# Patient Record
Sex: Female | Born: 1965 | Race: Black or African American | Hispanic: No | Marital: Married | Smoking: Current every day smoker
Health system: Southern US, Community
[De-identification: ages and names within clinical notes are randomized; demographics above are authoritative.]

## PROBLEM LIST (undated history)

## (undated) DIAGNOSIS — E785 Hyperlipidemia, unspecified: Secondary | ICD-10-CM

## (undated) DIAGNOSIS — D649 Anemia, unspecified: Secondary | ICD-10-CM

## (undated) DIAGNOSIS — L309 Dermatitis, unspecified: Secondary | ICD-10-CM

## (undated) DIAGNOSIS — M1990 Unspecified osteoarthritis, unspecified site: Secondary | ICD-10-CM

## (undated) DIAGNOSIS — B009 Herpesviral infection, unspecified: Secondary | ICD-10-CM

## (undated) HISTORY — DX: Dermatitis, unspecified: L30.9

## (undated) HISTORY — PX: OTHER SURGICAL HISTORY: SHX169

## (undated) HISTORY — DX: Unspecified osteoarthritis, unspecified site: M19.90

## (undated) HISTORY — DX: Herpesviral infection, unspecified: B00.9

## (undated) HISTORY — DX: Anemia, unspecified: D64.9

## (undated) HISTORY — DX: Hyperlipidemia, unspecified: E78.5

---

## 2002-06-22 ENCOUNTER — Encounter: Payer: Self-pay | Admitting: Emergency Medicine

## 2002-06-22 ENCOUNTER — Emergency Department (HOSPITAL_COMMUNITY): Admission: EM | Admit: 2002-06-22 | Discharge: 2002-06-22 | Payer: Self-pay | Admitting: Emergency Medicine

## 2003-12-21 HISTORY — PX: REDUCTION MAMMAPLASTY: SUR839

## 2005-06-08 ENCOUNTER — Ambulatory Visit (HOSPITAL_COMMUNITY): Admission: RE | Admit: 2005-06-08 | Discharge: 2005-06-08 | Payer: Self-pay | Admitting: Plastic Surgery

## 2006-06-09 ENCOUNTER — Ambulatory Visit (HOSPITAL_COMMUNITY): Admission: RE | Admit: 2006-06-09 | Discharge: 2006-06-09 | Payer: Self-pay | Admitting: Plastic Surgery

## 2006-10-24 ENCOUNTER — Encounter: Admission: RE | Admit: 2006-10-24 | Discharge: 2006-10-24 | Payer: Self-pay | Admitting: Family Medicine

## 2007-02-07 ENCOUNTER — Encounter: Admission: RE | Admit: 2007-02-07 | Discharge: 2007-02-07 | Payer: Self-pay | Admitting: Family Medicine

## 2007-06-10 ENCOUNTER — Emergency Department (HOSPITAL_COMMUNITY): Admission: EM | Admit: 2007-06-10 | Discharge: 2007-06-10 | Payer: Self-pay | Admitting: Emergency Medicine

## 2009-04-09 ENCOUNTER — Encounter: Admission: RE | Admit: 2009-04-09 | Discharge: 2009-04-09 | Payer: Self-pay | Admitting: Family Medicine

## 2009-04-11 ENCOUNTER — Encounter: Admission: RE | Admit: 2009-04-11 | Discharge: 2009-04-11 | Payer: Self-pay | Admitting: Family Medicine

## 2010-11-03 ENCOUNTER — Encounter: Admission: RE | Admit: 2010-11-03 | Discharge: 2010-11-03 | Payer: Self-pay | Admitting: Family Medicine

## 2011-01-10 ENCOUNTER — Encounter: Payer: Self-pay | Admitting: Family Medicine

## 2011-10-06 LAB — I-STAT 8, (EC8 V) (CONVERTED LAB)
Acid-base deficit: 2
BUN: 18
Bicarbonate: 22.6
Chloride: 109
Glucose, Bld: 75
HCT: 32 — ABNORMAL LOW
Hemoglobin: 10.9 — ABNORMAL LOW
Operator id: 161631
Potassium: 4
Sodium: 139
TCO2: 24
pCO2, Ven: 35.2 — ABNORMAL LOW
pH, Ven: 7.414 — ABNORMAL HIGH

## 2011-10-06 LAB — POCT I-STAT CREATININE
Creatinine, Ser: 0.6
Operator id: 161631

## 2011-10-06 LAB — CBC
HCT: 28.1 — ABNORMAL LOW
MCHC: 31.7
Platelets: 244
WBC: 5.1

## 2011-10-06 LAB — POCT CARDIAC MARKERS
CKMB, poc: <1 — ABNORMAL LOW
CKMB, poc: <1 — ABNORMAL LOW
Myoglobin, poc: 26.4
Myoglobin, poc: 26.5
Operator id: 161631
Operator id: 161631
Troponin i, poc: <0.05
Troponin i, poc: <0.05

## 2011-10-06 LAB — POCT PREGNANCY, URINE
Operator id: 161631
Preg Test, Ur: NEGATIVE

## 2011-10-06 LAB — D-DIMER, QUANTITATIVE: D-Dimer, Quant: 1.08 — ABNORMAL HIGH

## 2013-01-10 ENCOUNTER — Emergency Department (HOSPITAL_COMMUNITY)
Admission: EM | Admit: 2013-01-10 | Discharge: 2013-01-10 | Disposition: A | Discharge status: Home / Self Care | Payer: PRIVATE HEALTH INSURANCE | Source: Home / Self Care | Attending: Emergency Medicine | Admitting: Emergency Medicine

## 2013-01-10 ENCOUNTER — Encounter (HOSPITAL_COMMUNITY): Payer: Self-pay | Admitting: *Deleted

## 2013-01-10 DIAGNOSIS — R05 Cough: Secondary | ICD-10-CM

## 2013-01-10 MED ORDER — PREDNISONE 20 MG PO TABS: 40.0000 mg | ORAL_TABLET | Freq: Every day | ORAL | Status: AC

## 2013-01-10 NOTE — ED Provider Notes (Signed)
History     CSN: [id number]  Arrival date & time 01/10/13  1811   First MD Initiated Contact with Patient 01/10/13 1844      Chief Complaint  Patient presents with  . URI    (Consider location/radiation/quality/duration/timing/severity/associated sxs/prior treatment) HPI Comments: Patient presents urgent care this evening complaining that she continues to experience cough and congestion of her chest for the last 2-3 weeks. She has been seen by a provider where she was provided with antibiotics for 5 days. Which she feels helped initially but her symptoms seem to have come back. Patient is coughing up initially dry cough and in told yesterday that she started taking some Mucinex started with a productive cough now. Denies any fevers or shortness of breath. The does have had some sneezing, and some facial congestion. Denies any sore throat. Does feel like something she eats have elicited some episodes of diarrhea.  Patient is a 47 y.o. female presenting with URI. The history is provided by the patient.  URI The primary symptoms include cough and nausea. Primary symptoms do not include fever, fatigue, wheezing, abdominal pain, vomiting, myalgias, arthralgias or rash. The current episode started more than 1 week ago. The problem has not changed since onset. Symptoms associated with the illness include chills, facial pain, congestion and rhinorrhea.    History reviewed. No pertinent past medical history.  Past Surgical History  Procedure Date  . Cesarean section x2  . Breast reduction     Family History  Problem Relation Age of Onset  . Diabetes Other   . Heart failure Other   . Hypertension Other     History  Substance Use Topics  . Smoking status: Current Every Day Smoker -- 0.5 packs/day    Types: Cigarettes  . Smokeless tobacco: Not on file  . Alcohol Use: Yes    OB History    Grav Para Term Preterm Abortions TAB SAB Ect Mult Living                  Review of  Systems  Constitutional: Positive for chills and activity change. Negative for fever, fatigue and unexpected weight change.  HENT: Positive for congestion and rhinorrhea.   Respiratory: Positive for cough. Negative for apnea, chest tightness, shortness of breath, wheezing and stridor.   Gastrointestinal: Positive for nausea. Negative for vomiting and abdominal pain.  Musculoskeletal: Negative for myalgias and arthralgias.  Skin: Negative for color change, pallor and rash.  Neurological: Negative for dizziness.    Allergies  Penicillins  Home Medications   Current Outpatient Rx  Name  Route  Sig  Dispense  Refill  . PREDNISONE 20 MG PO TABS   Oral   Take 2 tablets (40 mg total) by mouth daily. 2 tablets daily for 5 days   10 tablet   0   . VALACYCLOVIR HCL 500 MG PO TABS   Oral   Take 500 mg by mouth 2 (two) times daily.         Marland Kitchen ZOLPIDEM TARTRATE 5 MG PO TABS   Oral   Take 5 mg by mouth at bedtime as needed.           BP 153/90  Pulse 74  Temp 98.7 F (37.1 C) (Oral)  Resp 20  Ht 5\' 1"  (1.549 m)  Wt 150 lb (68.04 kg)  BMI 28.34 kg/m2  SpO2 96%  LMP 01/03/2013  Physical Exam  Nursing note and vitals reviewed. Constitutional: She appears well-developed and well-nourished. No  distress.  HENT:  Right Ear: Tympanic membrane normal.  Left Ear: Tympanic membrane normal.  Nose: Rhinorrhea present.  Mouth/Throat: Uvula is midline. Posterior oropharyngeal erythema present.  Eyes: Conjunctivae normal are normal. Right eye exhibits no discharge. Left eye exhibits no discharge. No scleral icterus.  Neck: Neck supple. No JVD present.  Cardiovascular: Normal rate.  Exam reveals no gallop and no friction rub.   No murmur heard. Pulmonary/Chest: Effort normal and breath sounds normal. No respiratory distress. She has no decreased breath sounds. She has no wheezes. She has no rhonchi. She has no rales. She exhibits no tenderness.  Abdominal: Soft.  Lymphadenopathy:     She has no cervical adenopathy.  Skin: No rash noted. No erythema.    ED Course  Procedures (including critical care time)  Labs Reviewed - No data to display No results found.   1. Cough       MDM  Reactive cough, most likely from recent respiratory process. Patient looks comfortable in no respiratory distress. Afebrile. Normal lung exam. Have encouraged patient to continue with Mucinex if he is prednisone for the next 5 days. Discuss what symptoms should warrant to return or follow-up appointment with her primary care Dr.      Jimmie Molly, MD 01/10/13 [phone]

## 2013-01-10 NOTE — ED Notes (Signed)
Pt reports uri symptoms for the past 3 weeks with no relief from antibiotics that were prescribed by employee dr. - cough, sneezing, facial congestion

## 2013-12-20 ENCOUNTER — Encounter (HOSPITAL_COMMUNITY): Payer: Self-pay | Admitting: Emergency Medicine

## 2013-12-20 ENCOUNTER — Emergency Department (HOSPITAL_COMMUNITY)
Admission: EM | Admit: 2013-12-20 | Discharge: 2013-12-20 | Disposition: A | Discharge status: Home / Self Care | Payer: PRIVATE HEALTH INSURANCE | Attending: Emergency Medicine | Admitting: Emergency Medicine

## 2013-12-20 ENCOUNTER — Emergency Department (HOSPITAL_COMMUNITY): Payer: PRIVATE HEALTH INSURANCE

## 2013-12-20 DIAGNOSIS — M5431 Sciatica, right side: Secondary | ICD-10-CM

## 2013-12-20 DIAGNOSIS — Z79899 Other long term (current) drug therapy: Secondary | ICD-10-CM | POA: Insufficient documentation

## 2013-12-20 DIAGNOSIS — Y929 Unspecified place or not applicable: Secondary | ICD-10-CM | POA: Insufficient documentation

## 2013-12-20 DIAGNOSIS — N390 Urinary tract infection, site not specified: Secondary | ICD-10-CM | POA: Insufficient documentation

## 2013-12-20 DIAGNOSIS — Z880 Allergy status to penicillin: Secondary | ICD-10-CM | POA: Insufficient documentation

## 2013-12-20 DIAGNOSIS — F17200 Nicotine dependence, unspecified, uncomplicated: Secondary | ICD-10-CM | POA: Insufficient documentation

## 2013-12-20 DIAGNOSIS — X500XXA Overexertion from strenuous movement or load, initial encounter: Secondary | ICD-10-CM | POA: Insufficient documentation

## 2013-12-20 DIAGNOSIS — IMO0002 Reserved for concepts with insufficient information to code with codable children: Secondary | ICD-10-CM | POA: Insufficient documentation

## 2013-12-20 DIAGNOSIS — M5430 Sciatica, unspecified side: Secondary | ICD-10-CM | POA: Insufficient documentation

## 2013-12-20 DIAGNOSIS — M549 Dorsalgia, unspecified: Secondary | ICD-10-CM

## 2013-12-20 DIAGNOSIS — Y9389 Activity, other specified: Secondary | ICD-10-CM | POA: Insufficient documentation

## 2013-12-20 LAB — BASIC METABOLIC PANEL
BUN: 17 mg/dL (ref 6–23)
CALCIUM: 8.9 mg/dL (ref 8.4–10.5)
CO2: 25 meq/L (ref 19–32)
CREATININE: 0.5 mg/dL (ref 0.50–1.10)
Chloride: 104 mEq/L (ref 96–112)
Glucose, Bld: 113 mg/dL — ABNORMAL HIGH (ref 70–99)
Potassium: 4.1 mEq/L (ref 3.7–5.3)
Sodium: 139 mEq/L (ref 137–147)

## 2013-12-20 LAB — URINALYSIS, ROUTINE W REFLEX MICROSCOPIC
Bilirubin Urine: NEGATIVE
GLUCOSE, UA: NEGATIVE mg/dL
KETONES UR: NEGATIVE mg/dL
Nitrite: NEGATIVE
PH: 6.5 (ref 5.0–8.0)
PROTEIN: NEGATIVE mg/dL
Specific Gravity, Urine: 1.029 (ref 1.005–1.030)
Urobilinogen, UA: 1 mg/dL (ref 0.0–1.0)

## 2013-12-20 LAB — CBC WITH DIFFERENTIAL/PLATELET
BASOS PCT: 1 % (ref 0–1)
Basophils Absolute: 0 10*3/uL (ref 0.0–0.1)
EOS PCT: 1 % (ref 0–5)
Eosinophils Absolute: 0.1 10*3/uL (ref 0.0–0.7)
HCT: 42.9 % (ref 36.0–46.0)
HEMOGLOBIN: 14.7 g/dL (ref 12.0–15.0)
Lymphocytes Relative: 17 % (ref 12–46)
Lymphs Abs: 1 10*3/uL (ref 0.7–4.0)
MCH: 33.9 pg (ref 26.0–34.0)
MCHC: 34.3 g/dL (ref 30.0–36.0)
MCV: 99.1 fL (ref 78.0–100.0)
MONO ABS: 0.6 10*3/uL (ref 0.1–1.0)
MONOS PCT: 10 % (ref 3–12)
Neutro Abs: 4.2 10*3/uL (ref 1.7–7.7)
Neutrophils Relative %: 71 % (ref 43–77)
PLATELETS: 155 10*3/uL (ref 150–400)
RBC: 4.33 MIL/uL (ref 3.87–5.11)
RDW: 14.1 % (ref 11.5–15.5)
WBC: 5.8 10*3/uL (ref 4.0–10.5)

## 2013-12-20 LAB — URINE MICROSCOPIC-ADD ON

## 2013-12-20 MED ORDER — SODIUM CHLORIDE 0.9 % IV SOLN: INTRAVENOUS | Status: DC

## 2013-12-20 MED ORDER — HYDROMORPHONE HCL 4 MG PO TABS: 4.0000 mg | ORAL_TABLET | Freq: Four times a day (QID) | ORAL | Status: DC | PRN

## 2013-12-20 MED ORDER — HYDROMORPHONE HCL PF 1 MG/ML IJ SOLN
1.0000 mg | Freq: Once | INTRAMUSCULAR | Status: AC
  Administered 2013-12-20: 1 mg via INTRAVENOUS
  Filled 2013-12-20: qty 1

## 2013-12-20 MED ORDER — CIPROFLOXACIN HCL 500 MG PO TABS: 500.0000 mg | ORAL_TABLET | Freq: Two times a day (BID) | ORAL | Status: DC

## 2013-12-20 MED ORDER — NAPROXEN 500 MG PO TABS: 500.0000 mg | ORAL_TABLET | Freq: Two times a day (BID) | ORAL | Status: DC

## 2013-12-20 MED ORDER — CYCLOBENZAPRINE HCL 10 MG PO TABS: 10.0000 mg | ORAL_TABLET | Freq: Two times a day (BID) | ORAL | Status: DC | PRN

## 2013-12-20 MED ORDER — ONDANSETRON HCL 4 MG/2ML IJ SOLN
4.0000 mg | Freq: Once | INTRAMUSCULAR | Status: AC
  Administered 2013-12-20: 4 mg via INTRAVENOUS
  Filled 2013-12-20: qty 2

## 2013-12-20 MED ORDER — SODIUM CHLORIDE 0.9 % IV BOLUS (SEPSIS)
250.0000 mL | Freq: Once | INTRAVENOUS | Status: AC
  Administered 2013-12-20: 250 mL via INTRAVENOUS

## 2013-12-20 NOTE — ED Notes (Signed)
Patient has tried to urinate but cant 

## 2013-12-20 NOTE — ED Notes (Signed)
Bed: WA03 Expected date:  Expected time:  Means of arrival:  Comments: EMS-low back/hip pain/IV pain med.

## 2013-12-20 NOTE — ED Notes (Signed)
Per EMS pt reports back pain with decreased urination since Monday, today pt reports putting her pants on when felt "something popping in lower back" and exrcuciating pain since. Pt was given 250 mcg fentanyl IV en route.

## 2013-12-20 NOTE — ED Provider Notes (Addendum)
CSN: [id number]     Arrival date & time 12/20/13  1253 History   First MD Initiated Contact with Elizabeth Abbott 12/20/13 1254     Chief Complaint  Elizabeth Abbott presents with  . Back Pain   (Consider location/radiation/quality/duration/timing/severity/associated sxs/prior Treatment) Elizabeth Abbott is a 48 y.o. female presenting with back pain. The history is provided by the Elizabeth Abbott and the EMS personnel.  Back Pain Associated symptoms: dysuria   Associated symptoms: no abdominal pain, no chest pain, no fever, no numbness and no weakness    Elizabeth Abbott brought in by EMS. Elizabeth Abbott with complaint of bilateral lower back pain since prior to Christmas. Today while getting dressed that a sudden pop sensation in the left lower part of her back with increased pain. EMS gave Elizabeth Abbott sent and they'll in route Elizabeth Abbott with some improvement. Elizabeth Abbott's also concerned about having urinary tract infection states his shed dysuria. She been drinking a lot of cranberry juice to help avert that. Not sure if the back pain is related to that. Elizabeth Abbott's had a history of some back problems on and off over the years but never this severe. Elizabeth Abbott states she has radiation of the back pain down the right posterior part of the leg to the knee but no numbness or weakness in the foot. Pain currently greater on the left than right pain is 10 out of 10. Radiating as described pain as sharp in nature. No specific injury.  History reviewed. No pertinent past medical history. Past Surgical History  Procedure Laterality Date  . Cesarean section  x2  . Breast reduction     Family History  Problem Relation Age of Onset  . Diabetes Other   . Heart failure Other   . Hypertension Other    History  Substance Use Topics  . Smoking status: Current Every Day Smoker -- 0.50 packs/day    Types: Cigarettes  . Smokeless tobacco: Not on file  . Alcohol Use: Yes   OB History   Grav Para Term Preterm Abortions TAB SAB Ect Mult Living                  Review of Systems  Constitutional: Negative for fever.  HENT: Negative for congestion.   Eyes: Negative for redness.  Respiratory: Negative for shortness of breath.   Cardiovascular: Negative for chest pain.  Gastrointestinal: Negative for abdominal pain.  Genitourinary: Positive for dysuria.  Musculoskeletal: Positive for back pain.  Skin: Negative for rash.  Neurological: Negative for weakness and numbness.  Hematological: Does not bruise/bleed easily.  Psychiatric/Behavioral: Negative for confusion.    Allergies  Penicillins and Percocet  Home Medications   Current Outpatient Rx  Name  Route  Sig  Dispense  Refill  . ibuprofen (ADVIL,MOTRIN) 800 MG tablet   Oral   Take 800 mg by mouth every 8 (eight) hours as needed for mild pain or moderate pain.         . metoprolol succinate (TOPROL-XL) 50 MG 24 hr tablet   Oral   Take 50 mg by mouth daily. Take with or immediately following a meal.         . valACYclovir (VALTREX) 500 MG tablet   Oral   Take 500 mg by mouth 2 (two) times daily as needed (coldsore).          Marland Kitchen zolpidem (AMBIEN) 5 MG tablet   Oral   Take 2.5 mg by mouth at bedtime as needed for sleep.          Marland Kitchen  cyclobenzaprine (FLEXERIL) 10 MG tablet   Oral   Take 1 tablet (10 mg total) by mouth 2 (two) times daily as needed for muscle spasms.   20 tablet   0   . HYDROmorphone (DILAUDID) 4 MG tablet   Oral   Take 1 tablet (4 mg total) by mouth every 6 (six) hours as needed for severe pain.   20 tablet   0   . naproxen (NAPROSYN) 500 MG tablet   Oral   Take 1 tablet (500 mg total) by mouth 2 (two) times daily.   14 tablet   0    BP 133/80  Pulse 87  Temp(Src) 98.1 F (36.7 C) (Oral)  Resp 19  SpO2 98%  LMP 11/25/2013 Physical Exam  Nursing note and vitals reviewed. Constitutional: She is oriented to person, place, and time. She appears well-developed and well-nourished. No distress.  HENT:  Head: Normocephalic and atraumatic.   Mouth/Throat: Oropharynx is clear and moist.  Eyes: Conjunctivae and EOM are normal. Pupils are equal, round, and reactive to light.  Neck: Normal range of motion.  Cardiovascular: Normal rate, regular rhythm and normal heart sounds.   No murmur heard. Pulmonary/Chest: Effort normal and breath sounds normal.  Abdominal: Soft. Bowel sounds are normal.  Musculoskeletal: Normal range of motion. She exhibits tenderness.  Mild tenderness to palpation to the lumbar part of the back bilaterally left greater than right. Lower extremities  without any focal neuro deficit.  Neurological: She is alert and oriented to person, place, and time. No cranial nerve deficit. She exhibits normal muscle tone. Coordination normal.  Skin: Skin is warm. No rash noted.    ED Course  Procedures (including critical care time) Labs Review Labs Reviewed  CBC WITH DIFFERENTIAL  URINALYSIS, ROUTINE W REFLEX MICROSCOPIC  BASIC METABOLIC PANEL   Results for orders placed during the hospital encounter of 12/20/13  CBC WITH DIFFERENTIAL      Result Value Range   WBC 5.8  4.0 - 10.5 K/uL   RBC 4.33  3.87 - 5.11 MIL/uL   Hemoglobin 14.7  12.0 - 15.0 g/dL   HCT 42.9  36.0 - 46.0 %   MCV 99.1  78.0 - 100.0 fL   MCH 33.9  26.0 - 34.0 pg   MCHC 34.3  30.0 - 36.0 g/dL   RDW 14.1  11.5 - 15.5 %   Platelets 155  150 - 400 K/uL   Neutrophils Relative % 71  43 - 77 %   Neutro Abs 4.2  1.7 - 7.7 K/uL   Lymphocytes Relative 17  12 - 46 %   Lymphs Abs 1.0  0.7 - 4.0 K/uL   Monocytes Relative 10  3 - 12 %   Monocytes Absolute 0.6  0.1 - 1.0 K/uL   Eosinophils Relative 1  0 - 5 %   Eosinophils Absolute 0.1  0.0 - 0.7 K/uL   Basophils Relative 1  0 - 1 %   Basophils Absolute 0.0  0.0 - 0.1 K/uL  BASIC METABOLIC PANEL      Result Value Range   Sodium 139  137 - 147 mEq/L   Potassium 4.1  3.7 - 5.3 mEq/L   Chloride 104  96 - 112 mEq/L   CO2 25  19 - 32 mEq/L   Glucose, Bld 113 (*) 70 - 99 mg/dL   BUN 17  6 - 23 mg/dL    Creatinine, Ser 0.50  0.50 - 1.10 mg/dL   Calcium 8.9  8.4 -  10.5 mg/dL   GFR calc non Af Amer >90  >90 mL/min   GFR calc Af Amer >90  >90 mL/min    Imaging Review Dg Lumbar Spine Complete  12/20/2013   CLINICAL DATA:  Injured back.  EXAM: LUMBAR SPINE - COMPLETE 4+ VIEW  COMPARISON:  None.  FINDINGS: Normal alignment of the lumbar vertebral bodies. Disc spaces and vertebral bodies are maintained. No acute bony findings. The facets are normally aligned. No definite pars defects. The visualized bony pelvis is intact.  IMPRESSION: Normal lumbar spine series.   Electronically Signed   By: Kalman Jewels M.D.   On: 12/20/2013 14:51    EKG Interpretation   None       MDM   1. Back pain   2. Sciatica, right    The Elizabeth Abbott most likely with mechanical back pain. X-ray of the area without any acute bony abnormalities. Elizabeth Abbott has some right-sided sciatica had a pop in the left side of the low back earlier today pain on both sides currently greater on the left. Pain radiates in the right posterior part of the leg down to the knee but no numbness or tingling in her weakness in the feet. Elizabeth Abbott has primary care Dr. If the labs and urinalysis are negative Elizabeth Abbott was concerned she had a urinary tract infection would recommend Elizabeth Abbott followup with primary care Dr. for an outpatient MRI and perhaps referral to orthopedics of their choice or neurosurgery of the choice. Work note provided to be off work for the next 5 days.    Mervin Kung, MD 12/20/13 1536  Urinalysis is still pending. Elizabeth Abbott's basic labs are normal.  Mervin Kung, MD 12/20/13 [phone]

## 2013-12-20 NOTE — Discharge Instructions (Signed)
Followup with your Dr. in [facility] area in the next few days to arrange for an outpatient MRI. Take pain medication the muscle relaxer Flexeril and anti-inflammatory medicine as directed. Return for any newer worse symptoms. Rest as much as possible. Work note provided.

## 2013-12-20 NOTE — ED Provider Notes (Addendum)
4:45 PM This care from Dr. Rogene Houston in signout with urinalysis pending. UA is suggestive of infection and with patient's symptoms of dysuria will treat although not greatest sample with many squamous cells.. Culture sent. PCN allergy noted.   Virgel Manifold, MD 12/20/13 1654  Virgel Manifold, MD 12/20/13 [phone]

## 2013-12-20 NOTE — ED Notes (Signed)
Patient will call in a few when she can urinate.

## 2013-12-21 LAB — URINE CULTURE
Colony Count: NO GROWTH
Culture: NO GROWTH

## 2014-04-25 DIAGNOSIS — F5101 Primary insomnia: Secondary | ICD-10-CM | POA: Insufficient documentation

## 2014-04-25 DIAGNOSIS — G4700 Insomnia, unspecified: Secondary | ICD-10-CM | POA: Insufficient documentation

## 2014-04-25 DIAGNOSIS — Z720 Tobacco use: Secondary | ICD-10-CM | POA: Insufficient documentation

## 2014-04-25 DIAGNOSIS — I10 Essential (primary) hypertension: Secondary | ICD-10-CM

## 2014-04-25 HISTORY — DX: Essential (primary) hypertension: I10

## 2014-06-07 DIAGNOSIS — B354 Tinea corporis: Secondary | ICD-10-CM | POA: Insufficient documentation

## 2014-08-08 DIAGNOSIS — M545 Low back pain, unspecified: Secondary | ICD-10-CM | POA: Insufficient documentation

## 2014-08-08 DIAGNOSIS — H6990 Unspecified Eustachian tube disorder, unspecified ear: Secondary | ICD-10-CM | POA: Insufficient documentation

## 2015-03-24 DIAGNOSIS — R197 Diarrhea, unspecified: Secondary | ICD-10-CM | POA: Insufficient documentation

## 2015-03-24 DIAGNOSIS — K219 Gastro-esophageal reflux disease without esophagitis: Secondary | ICD-10-CM | POA: Insufficient documentation

## 2015-03-24 DIAGNOSIS — M25551 Pain in right hip: Secondary | ICD-10-CM | POA: Insufficient documentation

## 2015-03-24 DIAGNOSIS — R200 Anesthesia of skin: Secondary | ICD-10-CM | POA: Insufficient documentation

## 2015-04-19 DIAGNOSIS — M5416 Radiculopathy, lumbar region: Secondary | ICD-10-CM | POA: Insufficient documentation

## 2015-04-19 DIAGNOSIS — M1611 Unilateral primary osteoarthritis, right hip: Secondary | ICD-10-CM | POA: Insufficient documentation

## 2015-08-12 DIAGNOSIS — D219 Benign neoplasm of connective and other soft tissue, unspecified: Secondary | ICD-10-CM | POA: Insufficient documentation

## 2015-08-12 DIAGNOSIS — N920 Excessive and frequent menstruation with regular cycle: Secondary | ICD-10-CM | POA: Insufficient documentation

## 2015-08-15 ENCOUNTER — Encounter (HOSPITAL_COMMUNITY): Payer: Self-pay | Admitting: *Deleted

## 2015-08-15 ENCOUNTER — Emergency Department (HOSPITAL_COMMUNITY): Payer: PRIVATE HEALTH INSURANCE

## 2015-08-15 ENCOUNTER — Emergency Department (HOSPITAL_COMMUNITY)
Admission: EM | Admit: 2015-08-15 | Discharge: 2015-08-15 | Disposition: A | Discharge status: Home / Self Care | Payer: PRIVATE HEALTH INSURANCE | Attending: Emergency Medicine | Admitting: Emergency Medicine

## 2015-08-15 DIAGNOSIS — Z79899 Other long term (current) drug therapy: Secondary | ICD-10-CM | POA: Insufficient documentation

## 2015-08-15 DIAGNOSIS — Z880 Allergy status to penicillin: Secondary | ICD-10-CM | POA: Diagnosis not present

## 2015-08-15 DIAGNOSIS — I10 Essential (primary) hypertension: Secondary | ICD-10-CM | POA: Diagnosis present

## 2015-08-15 DIAGNOSIS — R42 Dizziness and giddiness: Secondary | ICD-10-CM | POA: Insufficient documentation

## 2015-08-15 DIAGNOSIS — Z791 Long term (current) use of non-steroidal anti-inflammatories (NSAID): Secondary | ICD-10-CM | POA: Insufficient documentation

## 2015-08-15 DIAGNOSIS — Z792 Long term (current) use of antibiotics: Secondary | ICD-10-CM | POA: Insufficient documentation

## 2015-08-15 DIAGNOSIS — D259 Leiomyoma of uterus, unspecified: Secondary | ICD-10-CM | POA: Insufficient documentation

## 2015-08-15 DIAGNOSIS — R079 Chest pain, unspecified: Secondary | ICD-10-CM | POA: Diagnosis not present

## 2015-08-15 DIAGNOSIS — Z720 Tobacco use: Secondary | ICD-10-CM | POA: Diagnosis not present

## 2015-08-15 LAB — CBC
HCT: 40.7 % (ref 36.0–46.0)
Hemoglobin: 13.9 g/dL (ref 12.0–15.0)
MCH: 33.4 pg (ref 26.0–34.0)
MCHC: 34.2 g/dL (ref 30.0–36.0)
MCV: 97.8 fL (ref 78.0–100.0)
PLATELETS: 196 10*3/uL (ref 150–400)
RBC: 4.16 MIL/uL (ref 3.87–5.11)
RDW: 13.9 % (ref 11.5–15.5)
WBC: 7 10*3/uL (ref 4.0–10.5)

## 2015-08-15 LAB — I-STAT TROPONIN, ED: Troponin i, poc: 0 ng/mL (ref 0.00–0.08)

## 2015-08-15 LAB — URINALYSIS, ROUTINE W REFLEX MICROSCOPIC
BILIRUBIN URINE: NEGATIVE
Glucose, UA: NEGATIVE mg/dL
HGB URINE DIPSTICK: NEGATIVE
Ketones, ur: NEGATIVE mg/dL
Leukocytes, UA: NEGATIVE
Nitrite: NEGATIVE
PH: 6.5 (ref 5.0–8.0)
Protein, ur: NEGATIVE mg/dL
SPECIFIC GRAVITY, URINE: 1.017 (ref 1.005–1.030)
Urobilinogen, UA: 0.2 mg/dL (ref 0.0–1.0)

## 2015-08-15 LAB — BASIC METABOLIC PANEL
Anion gap: 8 (ref 5–15)
BUN: 9 mg/dL (ref 6–20)
CALCIUM: 9.1 mg/dL (ref 8.9–10.3)
CO2: 23 mmol/L (ref 22–32)
CREATININE: 0.56 mg/dL (ref 0.44–1.00)
Chloride: 105 mmol/L (ref 101–111)
GFR calc Af Amer: >60 mL/min (ref >60)
GFR calc non Af Amer: >60 mL/min (ref >60)
GLUCOSE: 81 mg/dL (ref 65–99)
Potassium: 3.9 mmol/L (ref 3.5–5.1)
Sodium: 136 mmol/L (ref 135–145)

## 2015-08-15 MED ORDER — HYDROCODONE-ACETAMINOPHEN 5-325 MG PO TABS
1.0000 | ORAL_TABLET | Freq: Once | ORAL | Status: AC
  Administered 2015-08-15: 1 via ORAL
  Filled 2015-08-15: qty 1

## 2015-08-15 MED ORDER — HYDROCODONE-ACETAMINOPHEN 5-325 MG PO TABS: 1.0000 | ORAL_TABLET | Freq: Four times a day (QID) | ORAL | Status: DC | PRN

## 2015-08-15 NOTE — Discharge Instructions (Signed)
Fibroids Fibroids are lumps (tumors) that can occur any place in a woman's body. These lumps are not cancerous. Fibroids vary in size, weight, and where they grow. HOME CARE  Do not take aspirin.  Write down the number of pads or tampons you use during your period. Tell your doctor. This can help determine the best treatment for you. GET HELP RIGHT AWAY IF:  You have pain in your lower belly (abdomen) that is not helped with medicine.  You have cramps that are not helped with medicine.  You have more bleeding between or during your period.  You feel lightheaded or pass out (faint).  Your lower belly pain gets worse. MAKE SURE YOU:  Understand these instructions.  Will watch your condition.  Will get help right away if you are not doing well or get worse. Document Released: 01/08/2011 Document Revised: 02/28/2012 Document Reviewed: 01/08/2011 Exit[employer] Patient Information 2015 [employer]. This information is not intended to replace advice given to you by your health care provider. Make sure you discuss any questions you have with your health care provider.  

## 2015-08-15 NOTE — ED Notes (Signed)
Pt reports that she was sent her by her dr for hypertension. Pt reports dizziness, chest palpitations, and lower abdominal pain that has been ongoing. NAD noted in triage.

## 2015-08-15 NOTE — ED Provider Notes (Signed)
CSN: [id number]     Arrival date & time 08/15/15  1156 History   First MD Initiated Contact with Patient 08/15/15 1431     Chief Complaint  Patient presents with  . Hypertension  . Dizziness  . Chest Pain     (Consider location/radiation/quality/duration/timing/severity/associated sxs/prior Treatment) HPI Comments: Patient is a 49 year old female with a history of hypertension on metoprolol and recently diagnosed fibroids 2 presents today with persistent high blood pressure. Patient saw her doctor and 08/12/2015 to get medication refills and at that time was noted to have an elevated blood pressure. She wanted to start a new blood pressure medication however patient was hesitant ciliary increased her metoprolol to 75 mg. Patient has been taking 75 mg for the last 2 days but has noted that she continues to have elevated blood pressures usually 160/100. She denies any chest pain, shortness of breath but will occasionally feel lightheaded. She is also noticed over the last 4 days she has intense abdominal cramping in the suprapubic region that feels like a period cramps but also some slight dysuria associated with it. Her last menstrual period finished last week and she has not noticed any further vaginal bleeding. She denies any vaginal discharge, back pain, nausea, vomiting, diarrhea, constipation or fever.  Patient is a 49 y.o. female presenting with hypertension, dizziness, and chest pain. The history is provided by the patient.  Hypertension This is a recurrent problem. Episode onset: 3 days. The problem occurs constantly. The problem has not changed since onset.Associated symptoms include chest pain.  Dizziness Associated symptoms: chest pain   Chest Pain Associated symptoms: dizziness     History reviewed. No pertinent past medical history. Past Surgical History  Procedure Laterality Date  . Cesarean section  x2  . Breast reduction     Family History  Problem Relation Age of Onset   . Diabetes Other   . Heart failure Other   . Hypertension Other    Social History  Substance Use Topics  . Smoking status: Current Every Day Smoker -- 0.50 packs/day    Types: Cigarettes  . Smokeless tobacco: None  . Alcohol Use: Yes   OB History    No data available     Review of Systems  Cardiovascular: Positive for chest pain.  Neurological: Positive for dizziness.  All other systems reviewed and are negative.     Allergies  Penicillins and Percocet  Home Medications   Prior to Admission medications   Medication Sig Start Date End Date Taking? Authorizing Provider  ciprofloxacin (CIPRO) 500 MG tablet Take 1 tablet (500 mg total) by mouth every 12 (twelve) hours. 12/20/13   Virgel Manifold, MD  cyclobenzaprine (FLEXERIL) 10 MG tablet Take 1 tablet (10 mg total) by mouth 2 (two) times daily as needed for muscle spasms. 12/20/13   Fredia Sorrow, MD  HYDROcodone-acetaminophen (NORCO/VICODIN) 5-325 MG per tablet Take 1-2 tablets by mouth every 6 (six) hours as needed. 08/15/15   Blanchie Dessert, MD  HYDROmorphone (DILAUDID) 4 MG tablet Take 1 tablet (4 mg total) by mouth every 6 (six) hours as needed for severe pain. 12/20/13   Fredia Sorrow, MD  ibuprofen (ADVIL,MOTRIN) 800 MG tablet Take 800 mg by mouth every 8 (eight) hours as needed for mild pain or moderate pain.    Historical Provider, MD  metoprolol succinate (TOPROL-XL) 50 MG 24 hr tablet Take 50 mg by mouth daily. Take with or immediately following a meal.    Historical Provider, MD  naproxen (NAPROSYN)  500 MG tablet Take 1 tablet (500 mg total) by mouth 2 (two) times daily. 12/20/13   Fredia Sorrow, MD  valACYclovir (VALTREX) 500 MG tablet Take 500 mg by mouth 2 (two) times daily as needed (coldsore).     Historical Provider, MD  zolpidem (AMBIEN) 5 MG tablet Take 2.5 mg by mouth at bedtime as needed for sleep.     Historical Provider, MD   BP 140/80 mmHg  Pulse 60  Temp(Src) 98.8 F (37.1 C) (Oral)  Resp 18  Ht 5'  1" (1.549 m)  Wt 159 lb (72.122 kg)  BMI 30.06 kg/m2  SpO2 100%  LMP 08/13/2015 Physical Exam  Constitutional: She is oriented to person, place, and time. She appears well-developed and well-nourished. No distress.  HENT:  Head: Normocephalic and atraumatic.  Mouth/Throat: Oropharynx is clear and moist.  Eyes: Conjunctivae and EOM are normal. Pupils are equal, round, and reactive to light.  Neck: Normal range of motion. Neck supple.  Cardiovascular: Normal rate, regular rhythm and intact distal pulses.   No murmur heard. Pulmonary/Chest: Effort normal and breath sounds normal. No respiratory distress. She has no wheezes. She has no rales.  Abdominal: Soft. She exhibits no distension. There is tenderness in the suprapubic area. There is no rebound, no guarding and no CVA tenderness.  Musculoskeletal: Normal range of motion. She exhibits no edema or tenderness.  Neurological: She is alert and oriented to person, place, and time.  Skin: Skin is warm and dry. No rash noted. No erythema.  Psychiatric: She has a normal mood and affect. Her behavior is normal.  Nursing note and vitals reviewed.   ED Course  Procedures (including critical care time) Labs Review Labs Reviewed  BASIC METABOLIC PANEL  CBC  URINALYSIS, ROUTINE W REFLEX MICROSCOPIC (NOT AT [facility])  Randolm Idol, ED    Imaging Review Dg Chest 2 View  08/15/2015   CLINICAL DATA:  Onset of dizziness and hypertension today, current tobacco use.  EXAM: CHEST  2 VIEW  COMPARISON:  PA and lateral chest x-ray of June 10, 2007  FINDINGS: The lungs are adequately inflated and clear. The heart and pulmonary vascularity are normal. The mediastinum is normal in width. There is no pleural effusion. The bony thorax is unremarkable.  IMPRESSION: There is no active cardiopulmonary disease.   Electronically Signed   By: David  Martinique M.D.   On: 08/15/2015 13:50   I have personally reviewed and evaluated these images and lab results as part  of my medical decision-making.   EKG Interpretation   Date/Time:  Friday August 15 2015 12:06:45 EDT Ventricular Rate:  75 PR Interval:  158 QRS Duration: 90 QT Interval:  364 QTC Calculation: 406 R Axis:   2 Text Interpretation:  Normal sinus rhythm Normal ECG No significant change  since last tracing Confirmed by Maryan Rued  MD, Loree Fee ([id number]) on 08/15/2015  2:16:14 PM      MDM   Final diagnoses:  Essential hypertension  Uterine leiomyoma, unspecified location    Patient with a history of hypertension and fibroids currently taking metoprolol presents today for persistent hypertension and 4 days of lower abdominal pain that feels like period cramps with some mild dysuria. Patient saw her doctor 3 days ago to get medication refills and that time they noticed her blood pressure was elevated. The patient that time did not want to try new medications so they increased her Toprol to 75 mg. Since that time she's had persistently elevated blood pressure and persistent abdominal  cramping in her doctor recommended she come here today. Patient is hemodynamically stable on exam and repeat pressures here have improved to 140/80. Patient has suprapubic abdominal tenderness with a negative urine and feel most likely this is related to her fibroids. She is in the process of making an appointment with an OB/GYN for further evaluation. She has no evidence of anemia, no acute EKG findings normal renal function and electrolytes and a negative chest x-ray.  No signs of end organ damage from her hypertension and it is improved here with rest. Patient given pain control for her fibroids as she denies any complaints concerning for GI symptoms and patient is sexually active with only one partner without symptoms of vaginal discharge or concerns for PID.    Blanchie Dessert, MD 08/15/15 1538

## 2015-08-27 DIAGNOSIS — F17200 Nicotine dependence, unspecified, uncomplicated: Secondary | ICD-10-CM | POA: Insufficient documentation

## 2015-10-27 DIAGNOSIS — G44229 Chronic tension-type headache, not intractable: Secondary | ICD-10-CM | POA: Insufficient documentation

## 2015-12-22 ENCOUNTER — Ambulatory Visit (INDEPENDENT_AMBULATORY_CARE_PROVIDER_SITE_OTHER): Payer: Managed Care, Other (non HMO) | Admitting: Emergency Medicine

## 2015-12-22 VITALS — BP 122/76 | HR 118 | Temp 99.5°F | Resp 17 | Ht 63.0 in | Wt 163.0 lb

## 2015-12-22 DIAGNOSIS — J209 Acute bronchitis, unspecified: Secondary | ICD-10-CM | POA: Diagnosis not present

## 2015-12-22 DIAGNOSIS — J0140 Acute pansinusitis, unspecified: Secondary | ICD-10-CM | POA: Diagnosis not present

## 2015-12-22 MED ORDER — LEVOFLOXACIN 500 MG PO TABS: 500.0000 mg | ORAL_TABLET | Freq: Every day | ORAL | Status: AC

## 2015-12-22 MED ORDER — HYDROCOD POLST-CPM POLST ER 10-8 MG/5ML PO SUER: 5.0000 mL | Freq: Two times a day (BID) | ORAL | Status: DC

## 2015-12-22 MED ORDER — PSEUDOEPHEDRINE-GUAIFENESIN ER 60-600 MG PO TB12: 1.0000 | ORAL_TABLET | Freq: Two times a day (BID) | ORAL | Status: AC

## 2015-12-22 NOTE — Progress Notes (Signed)
Subjective:  Patient ID: Elizabeth Abbott, female    DOB: 18-Mar-1966  Age: 50 y.o. MRN: [id number]  CC: Sore Throat; URI; Headache; Chills; and Leg Pain   HPI Elizabeth Abbott presents   Patient's several-day history chills and fever. No nasal congestion postnasal drainage is purulent character. Her cheeks and has a recent onset sneezing. She has a cough productive purulent sputum. She has no wheezing or shortness of breath. No nausea or vomiting no stool change. No rash.  History Elizabeth Abbott has no past medical history on file.   She has past surgical history that includes Cesarean section (x2) and breast reduction.   Her  family history includes Diabetes in her other; Heart failure in her other; Hypertension in her other.  She   reports that she has been smoking Cigarettes.  She has a 10 pack-year smoking history. She does not have any smokeless tobacco history on file. She reports that she drinks alcohol. She reports that she does not use illicit drugs.  Outpatient Prescriptions Prior to Visit  Medication Sig Dispense Refill  . ibuprofen (ADVIL,MOTRIN) 800 MG tablet Take 800 mg by mouth every 8 (eight) hours as needed for mild pain or moderate pain.    . valACYclovir (VALTREX) 500 MG tablet Take 500 mg by mouth 2 (two) times daily as needed (c[place]ore).     Marland Kitchen zolpidem (AMBIEN) 5 MG tablet Take 2.5 mg by mouth at bedtime as needed for sleep.     . ciprofloxacin (CIPRO) 500 MG tablet Take 1 tablet (500 mg total) by mouth every 12 (twelve) hours. 8 tablet 0  . cyclobenzaprine (FLEXERIL) 10 MG tablet Take 1 tablet (10 mg total) by mouth 2 (two) times daily as needed for muscle spasms. 20 tablet 0  . HYDROcodone-acetaminophen (NORCO/VICODIN) 5-325 MG per tablet Take 1-2 tablets by mouth every 6 (six) hours as needed. 15 tablet 0  . HYDROmorphone (DILAUDID) 4 MG tablet Take 1 tablet (4 mg total) by mouth every 6 (six) hours as needed for severe pain. 20 tablet 0  . metoprolol succinate  (TOPROL-XL) 50 MG 24 hr tablet Take 50 mg by mouth daily. Take with or immediately following a meal.    . naproxen (NAPROSYN) 500 MG tablet Take 1 tablet (500 mg total) by mouth 2 (two) times daily. 14 tablet 0   No facility-administered medications prior to visit.    Social History   Social History  . Marital Status: Single    Spouse Name: N/A  . Number of Children: N/A  . Years of Education: N/A   Social History Main Topics  . Smoking status: Current Every Day Smoker -- 0.50 packs/day for 20 years    Types: Cigarettes  . Smokeless tobacco: None  . Alcohol Use: 0.0 oz/week    0 Standard drinks or equivalent per week  . Drug Use: No  . Sexual Activity: Not Asked   Other Topics Concern  . None   Social History Narrative     Review of Systems  Constitutional: Positive for chills and fatigue. Negative for fever and appetite change.  HENT: Positive for congestion, postnasal drip, rhinorrhea, sinus pressure and sneezing. Negative for ear pain and sore throat.   Eyes: Negative for pain and redness.  Respiratory: Positive for cough. Negative for shortness of breath and wheezing.   Cardiovascular: Negative for leg swelling.  Gastrointestinal: Negative for nausea, vomiting, abdominal pain, diarrhea, constipation and blood in stool.  Endocrine: Negative for polyuria.  Genitourinary: Negative for dysuria,  urgency, frequency and flank pain.  Musculoskeletal: Negative for gait problem.  Skin: Negative for rash.  Neurological: Negative for weakness and headaches.  Psychiatric/Behavioral: Negative for confusion and decreased concentration. The patient is not nervous/anxious.     Objective:  BP 122/76 mmHg  Pulse 118  Temp(Src) 99.5 F (37.5 C) (Oral)  Resp 17  Ht 5\' 3"  (1.6 m)  Wt 163 lb (73.936 kg)  BMI 28.88 kg/m2  SpO2 99%  LMP 12/22/2015  Physical Exam  Constitutional: She is oriented to person, place, and time. She appears well-developed and well-nourished. No  distress.  HENT:  Head: Normocephalic and atraumatic.  Right Ear: External ear normal.  Left Ear: External ear normal.  Nose: Nose normal.  Eyes: Conjunctivae and EOM are normal. Pupils are equal, round, and reactive to light. No scleral icterus.  Neck: Normal range of motion. Neck supple. No tracheal deviation present.  Cardiovascular: Normal rate, regular rhythm and normal heart sounds.   Pulmonary/Chest: Effort normal. No respiratory distress. She has no wheezes. She has no rales.  Abdominal: She exhibits no mass. There is no tenderness. There is no rebound and no guarding.  Musculoskeletal: She exhibits no edema.  Lymphadenopathy:    She has no cervical adenopathy.  Neurological: She is alert and oriented to person, place, and time. Coordination normal.  Skin: Skin is warm and dry. No rash noted.  Psychiatric: She has a normal mood and affect. Her behavior is normal.      Assessment & Plan:   Elizabeth Abbott was seen today for sore throat, uri, headache, chills and leg pain.  Diagnoses and all orders for this visit:  Acute bronchitis, unspecified organism  Acute pansinusitis, recurrence not specified  Other orders -     pseudoephedrine-guaifenesin (MUCINEX D) 60-600 MG 12 hr tablet; Take 1 tablet by mouth every 12 (twelve) hours. -     chlorpheniramine-HYDROcodone (TUSSIONEX PENNKINETIC ER) 10-8 MG/5ML SUER; Take 5 mLs by mouth 2 (two) times daily. -     levofloxacin (LEVAQUIN) 500 MG tablet; Take 1 tablet (500 mg total) by mouth daily.   I have discontinued Elizabeth Abbott's metoprolol succinate, cyclobenzaprine, HYDROmorphone, naproxen, ciprofloxacin, and HYDROcodone-acetaminophen. I am also having her start on pseudoephedrine-guaifenesin, chlorpheniramine-HYDROcodone, and levofloxacin. Additionally, I am having her maintain her zolpidem, valACYclovir, ibuprofen, and losartan.  Meds ordered this encounter  Medications  . losartan (COZAAR) 50 MG tablet    Sig: Take 50 mg by mouth  daily.  . pseudoephedrine-guaifenesin (MUCINEX D) 60-600 MG 12 hr tablet    Sig: Take 1 tablet by mouth every 12 (twelve) hours.    Dispense:  18 tablet    Refill:  0  . chlorpheniramine-HYDROcodone (TUSSIONEX PENNKINETIC ER) 10-8 MG/5ML SUER    Sig: Take 5 mLs by mouth 2 (two) times daily.    Dispense:  60 mL    Refill:  0  . levofloxacin (LEVAQUIN) 500 MG tablet    Sig: Take 1 tablet (500 mg total) by mouth daily.    Dispense:  10 tablet    Refill:  0    Appropriate red flag conditions were discussed with the patient as well as actions that should be taken.  Patient expressed his understanding.  Follow-up: Return if symptoms worsen or fail to improve.  Roselee Culver, MD

## 2015-12-22 NOTE — Patient Instructions (Signed)

## 2016-08-17 DIAGNOSIS — Z23 Encounter for immunization: Secondary | ICD-10-CM | POA: Insufficient documentation

## 2017-08-18 ENCOUNTER — Encounter (HOSPITAL_COMMUNITY): Payer: Self-pay | Admitting: Nurse Practitioner

## 2017-08-18 ENCOUNTER — Ambulatory Visit (HOSPITAL_COMMUNITY)
Admission: EM | Admit: 2017-08-18 | Discharge: 2017-08-18 | Disposition: A | Discharge status: Home / Self Care | Payer: BLUE CROSS/BLUE SHIELD | Attending: Family Medicine | Admitting: Family Medicine

## 2017-08-18 DIAGNOSIS — B9789 Other viral agents as the cause of diseases classified elsewhere: Secondary | ICD-10-CM | POA: Diagnosis not present

## 2017-08-18 DIAGNOSIS — J069 Acute upper respiratory infection, unspecified: Secondary | ICD-10-CM

## 2017-08-18 MED ORDER — FLUTICASONE PROPIONATE 50 MCG/ACT NA SUSP: 2.0000 | Freq: Every day | NASAL | 2 refills | Status: DC

## 2017-08-18 MED ORDER — BENZONATATE 100 MG PO CAPS: 100.0000 mg | ORAL_CAPSULE | Freq: Three times a day (TID) | ORAL | 0 refills | Status: DC

## 2017-08-18 NOTE — ED Triage Notes (Signed)
Pt presents with c/o URI symptoms. Her symptoms began last night and have gotten worse throughout the day today. She c/o headache, ear fullness, nasal congestion, rhinorrhea, cough. She hasnt tried anything at home because she has a history of hypertension and doesn't want to take anything that will increase her blood pressure.

## 2017-08-22 NOTE — ED Provider Notes (Signed)
  [place]   [id number] 08/18/17 Arrival Time: 1801  ASSESSMENT & PLAN:  1. Viral URI with cough    Meds ordered this encounter  Medications  . benzonatate (TESSALON) 100 MG capsule    Sig: Take 1 capsule (100 mg total) by mouth every 8 (eight) hours.    Dispense:  21 capsule    Refill:  0  . fluticasone (FLONASE) 50 MCG/ACT nasal spray    Sig: Place 2 sprays into both nostrils daily.    Dispense:  16 g    Refill:  2   OTC analgesics and symptom care as needed.  Reviewed expectations re: course of current medical issues. Questions answered. Outlined signs and symptoms indicating need for more acute intervention. Patient verbalized understanding. After Visit Summary given.   SUBJECTIVE:  Elizabeth Abbott is a 51 y.o. female who presents with complaint of nasal congestion, post-nasal drainage. Acute onset last evening. Headache and ear fullness today. Persistent cough, non-productive. No wheezing or SOB. No n/v. Overall very fatigued. Some body aches. No OTC treatment. Smoker.   ROS: As per HPI.   OBJECTIVE:  Vitals:   08/18/17 1847  BP: 101/62  Pulse: 82  Resp: 17  Temp: 98.5 F (36.9 C)  TempSrc: Oral  SpO2: 99%     General appearance: alert; no distress HEENT: nasal congestion; clear runny nose; throat irritation secondary to post-nasal drainage Neck: supple without LAD Lungs: clear to auscultation bilaterally; dry cough Skin: warm and dry Psychological: alert and cooperative; normal mood and affect  Allergies  Allergen Reactions  . Penicillins     welts  . Percocet [Oxycodone-Acetaminophen] Itching   No PMH of lung disease.  Social History   Social History  . Marital status: Single    Spouse name: N/A  . Number of children: N/A  . Years of education: N/A   Occupational History  . Not on file.   Social History Main Topics  . Smoking status: Current Every Day Smoker    Packs/day: 0.50    Years: 20.00    Types: Cigarettes  .  Smokeless tobacco: Never Used  . Alcohol use 0.0 oz/week  . Drug use: No  . Sexual activity: Not on file   Other Topics Concern  . Not on file   Social History Narrative  . No narrative on file           Vanessa Kick, MD 08/22/17 [phone]

## 2018-01-27 DIAGNOSIS — E785 Hyperlipidemia, unspecified: Secondary | ICD-10-CM | POA: Insufficient documentation

## 2018-01-27 DIAGNOSIS — J989 Respiratory disorder, unspecified: Secondary | ICD-10-CM | POA: Insufficient documentation

## 2018-01-27 DIAGNOSIS — B009 Herpesviral infection, unspecified: Secondary | ICD-10-CM | POA: Insufficient documentation

## 2018-01-27 DIAGNOSIS — J302 Other seasonal allergic rhinitis: Secondary | ICD-10-CM | POA: Insufficient documentation

## 2019-10-16 ENCOUNTER — Other Ambulatory Visit: Payer: Self-pay | Admitting: Family Medicine

## 2019-10-16 DIAGNOSIS — F419 Anxiety disorder, unspecified: Secondary | ICD-10-CM | POA: Insufficient documentation

## 2019-10-16 DIAGNOSIS — N6310 Unspecified lump in the right breast, unspecified quadrant: Secondary | ICD-10-CM

## 2019-10-25 ENCOUNTER — Ambulatory Visit
Admission: RE | Admit: 2019-10-25 | Discharge: 2019-10-25 | Disposition: A | Discharge status: Home / Self Care | Payer: PRIVATE HEALTH INSURANCE | Source: Ambulatory Visit | Attending: Family Medicine | Admitting: Family Medicine

## 2019-10-25 ENCOUNTER — Other Ambulatory Visit: Payer: Self-pay

## 2019-10-25 DIAGNOSIS — N6310 Unspecified lump in the right breast, unspecified quadrant: Secondary | ICD-10-CM

## 2019-11-21 ENCOUNTER — Other Ambulatory Visit: Payer: Self-pay

## 2019-11-21 DIAGNOSIS — Z20822 Contact with and (suspected) exposure to covid-19: Secondary | ICD-10-CM

## 2019-11-24 ENCOUNTER — Telehealth: Payer: Self-pay | Admitting: Family Medicine

## 2019-11-24 LAB — NOVEL CORONAVIRUS, NAA: SARS-CoV-2, NAA: DETECTED — AB

## 2019-11-24 NOTE — Telephone Encounter (Signed)
Pt returning a call to get COVID results.

## 2019-11-27 ENCOUNTER — Telehealth: Payer: Self-pay | Admitting: *Deleted

## 2019-11-27 NOTE — Telephone Encounter (Signed)
Charted in error; see TE from 11/27/2019

## 2019-11-27 NOTE — Telephone Encounter (Signed)
Pt called requesting note for clearance to return to work; explained that she has to meet the previously discussed criteria for d/c of quarantine; also explained she needs to contact her HR for specific guidelines for return; pt also advised to contact her PCP; she verbalized understanding.

## 2020-03-31 IMAGING — US US BREAST*R* LIMITED INC AXILLA
1 series · 2 of 2 positions shown · non-contrast
Comparison: Previous exam(s).

CLINICAL DATA: Patient presents with a palpable area of concern in
the lower medial right breast. History of bilateral reduction
mammoplasty in 0221.

EXAM:
DIGITAL DIAGNOSTIC BILATERAL MAMMOGRAM WITH CAD AND TOMO
ULTRASOUND RIGHT BREAST

[Series 1: us breast*right* limited inc axilla · 0.06mm/px · 2 of 2 slices shown]
[im 1/2]
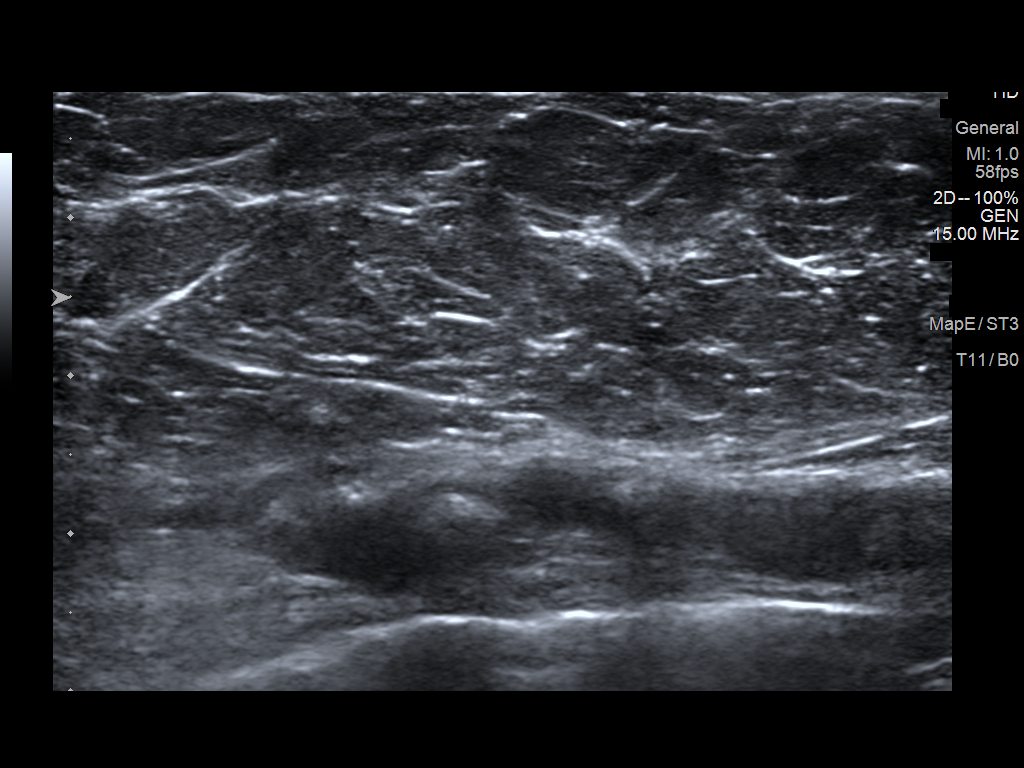
[im 2/2]
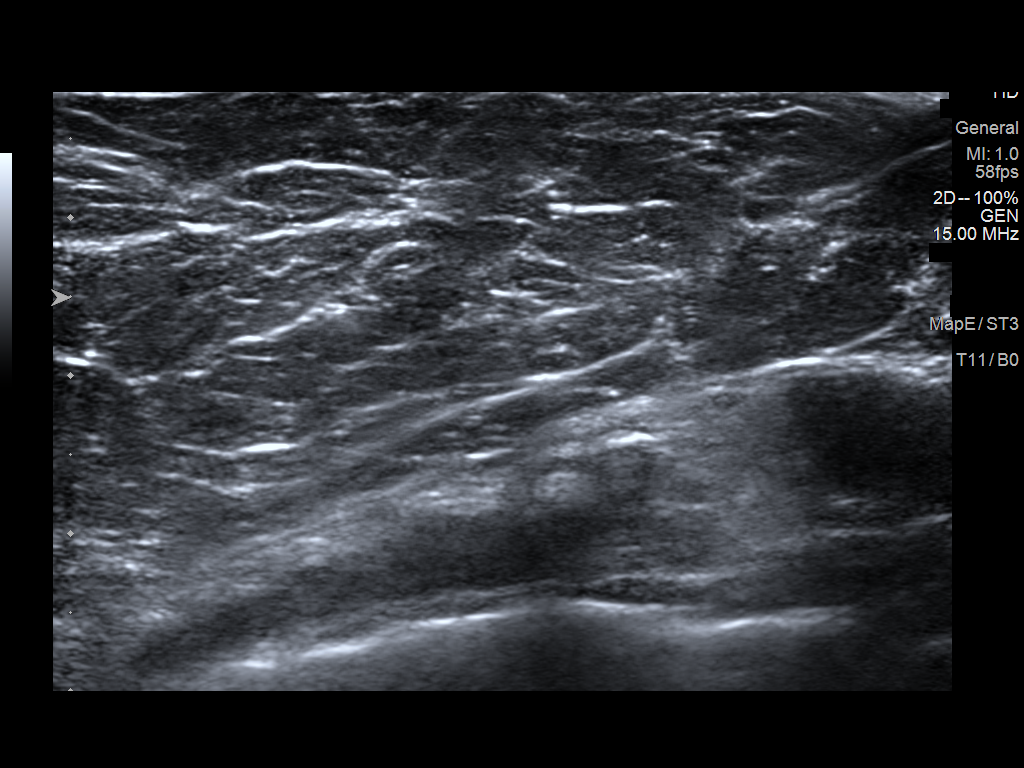

[2 of 2 positions shown; findings below may reference images not displayed]

ACR Breast Density Category b: There are scattered areas of
fibroglandular density.
FINDINGS: Mammogram:

Right breast: No suspicious mass, distortion, or microcalcifications
are identified to suggest presence of malignancy. There are post
reduction surgical changes in the inferior right breast.

Left breast:No suspicious mass, distortion, or microcalcifications
are identified to suggest presence of malignancy. There are post
reduction surgical changes in the inferior left breast.

Mammographic images were processed with CAD.

On physical exam I palpate thickening along the patient's scar in
the inferior medial aspect of the breast at her palpable site of
concern. No discrete fixed mass.

Ultrasound:

Targeted ultrasound is performed at 4 o'clock 9 cm from the nipple
demonstrating no discrete cystic or solid abnormality.
IMPRESSION: No mammographic or sonographic evidence of malignancy. At the
palpable site of concern in the right breast at 4 o'clock there is
no suspicious abnormality. The patient may be feeling scar tissue
from reduction mammoplasty.

RECOMMENDATION:
Screening mammogram in one year.(Code:04-J-H3S)

I have discussed the findings and recommendations with the patient.
If applicable, a reminder letter will be sent to the patient
regarding the next appointment.

BI-RADS CATEGORY  1: Negative.

## 2020-03-31 IMAGING — MG DIGITAL DIAGNOSTIC BILAT W/ TOMO W/ CAD
6 of 9 series · 6 of 25 positions shown · non-contrast
Comparison: Previous exam(s).

CLINICAL DATA: Patient presents with a palpable area of concern in
the lower medial right breast. History of bilateral reduction
mammoplasty in 0221.

EXAM:
DIGITAL DIAGNOSTIC BILATERAL MAMMOGRAM WITH CAD AND TOMO
ULTRASOUND RIGHT BREAST

[L ML]
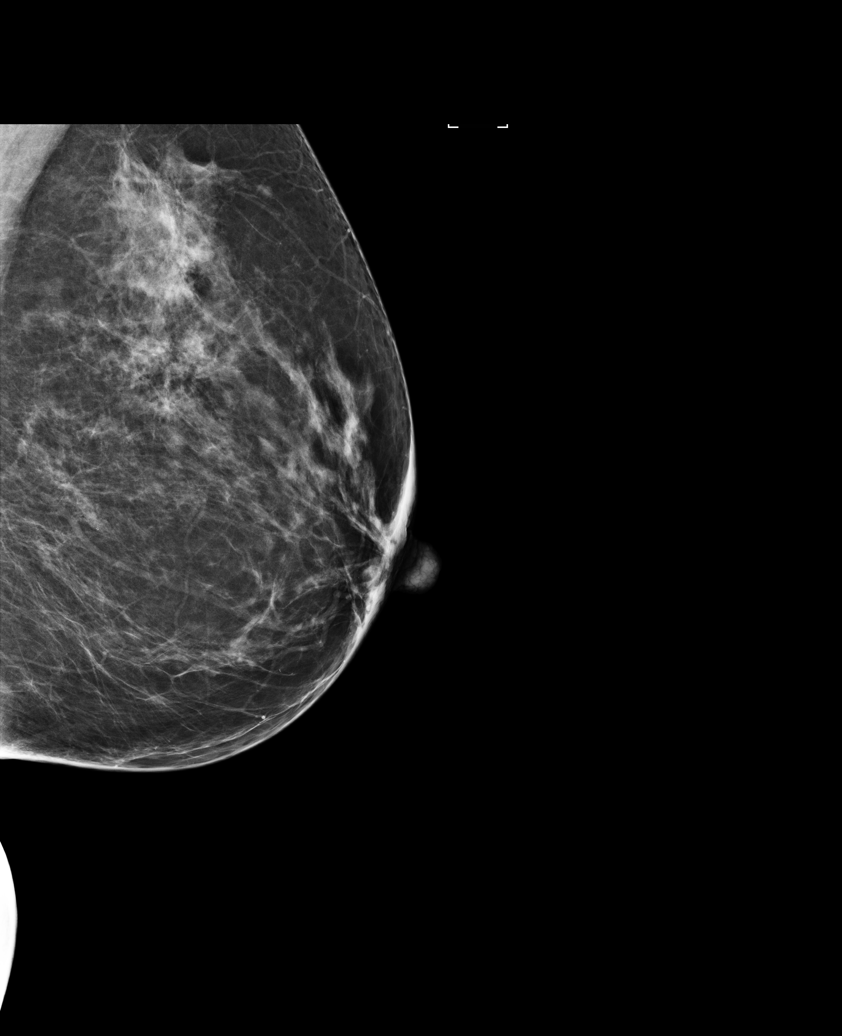

[L MLO synth-2D]
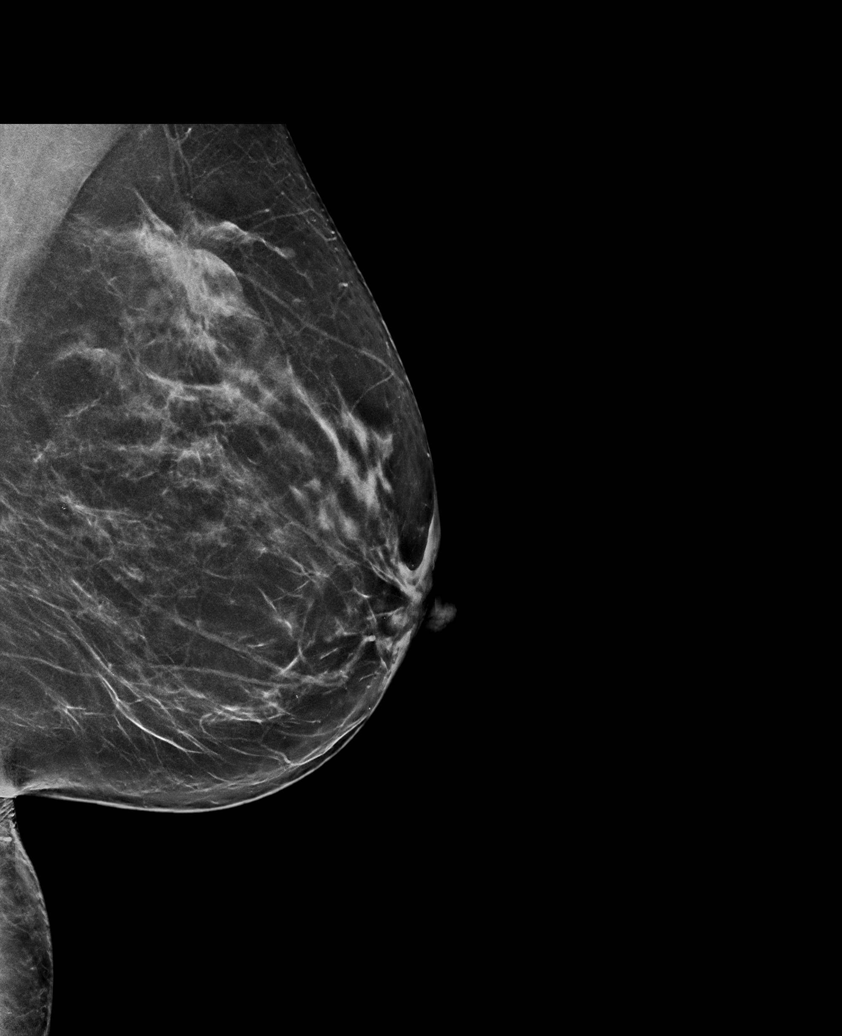

[L CC synth-2D]
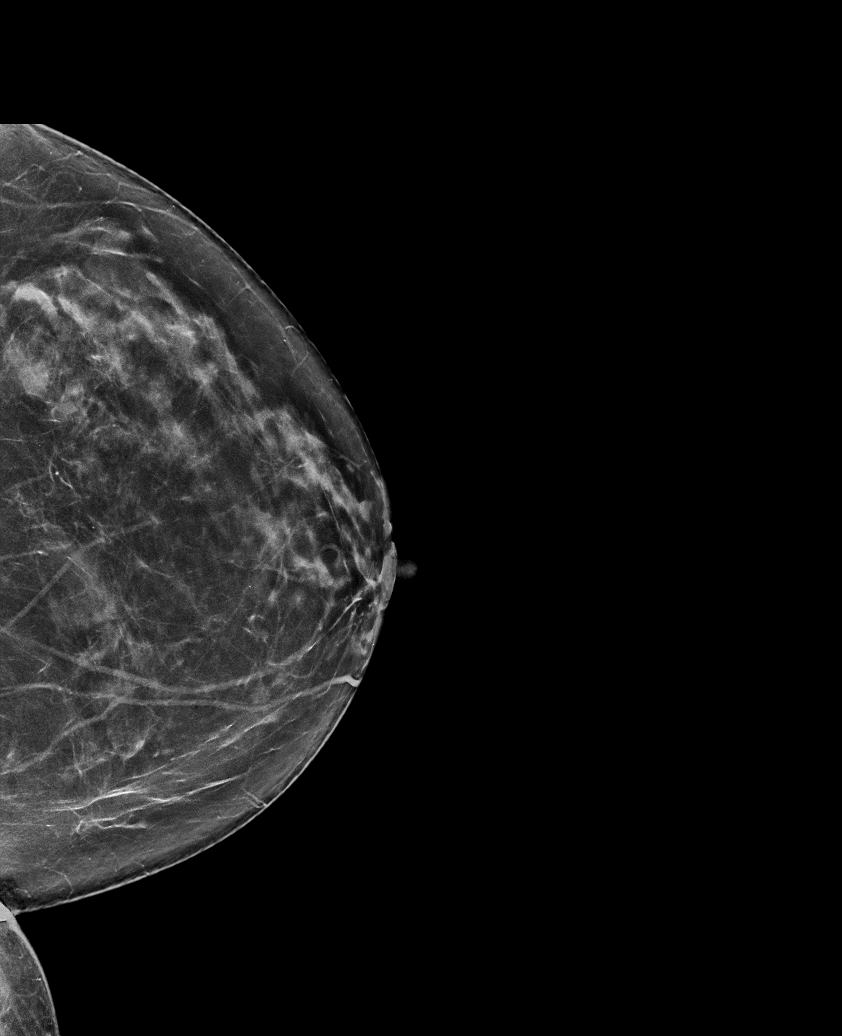

[R CC synth-2D]
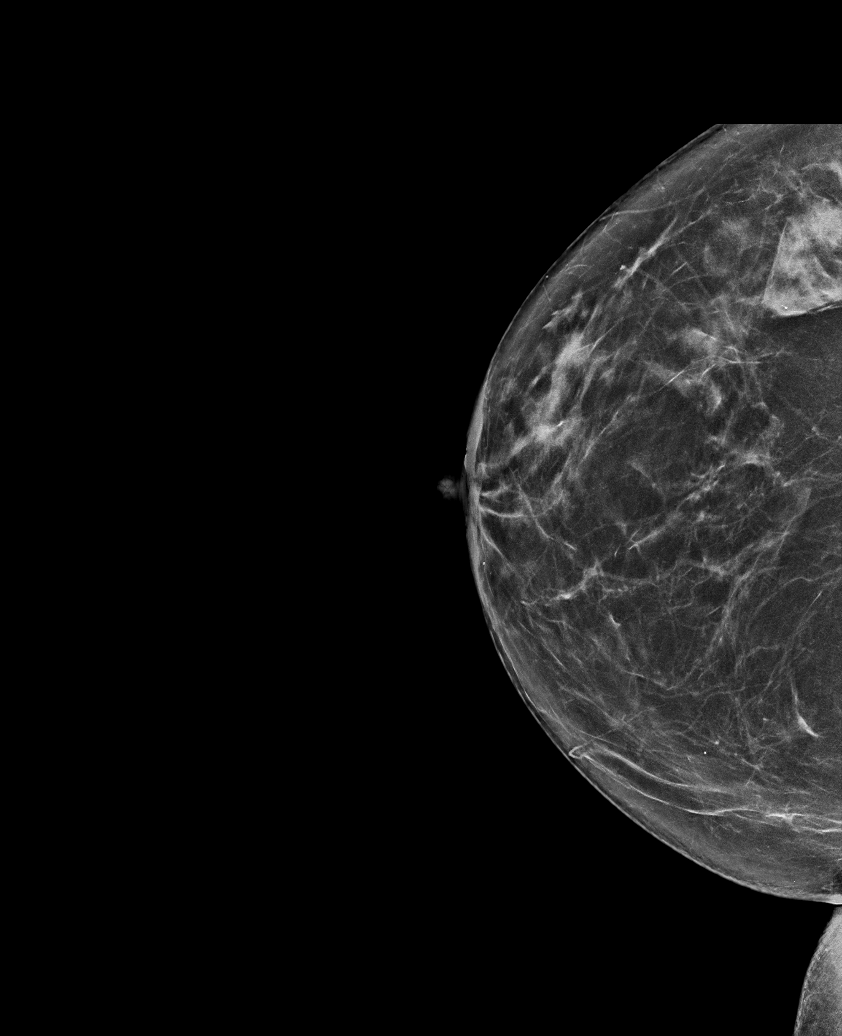

[R MLO synth-2D]
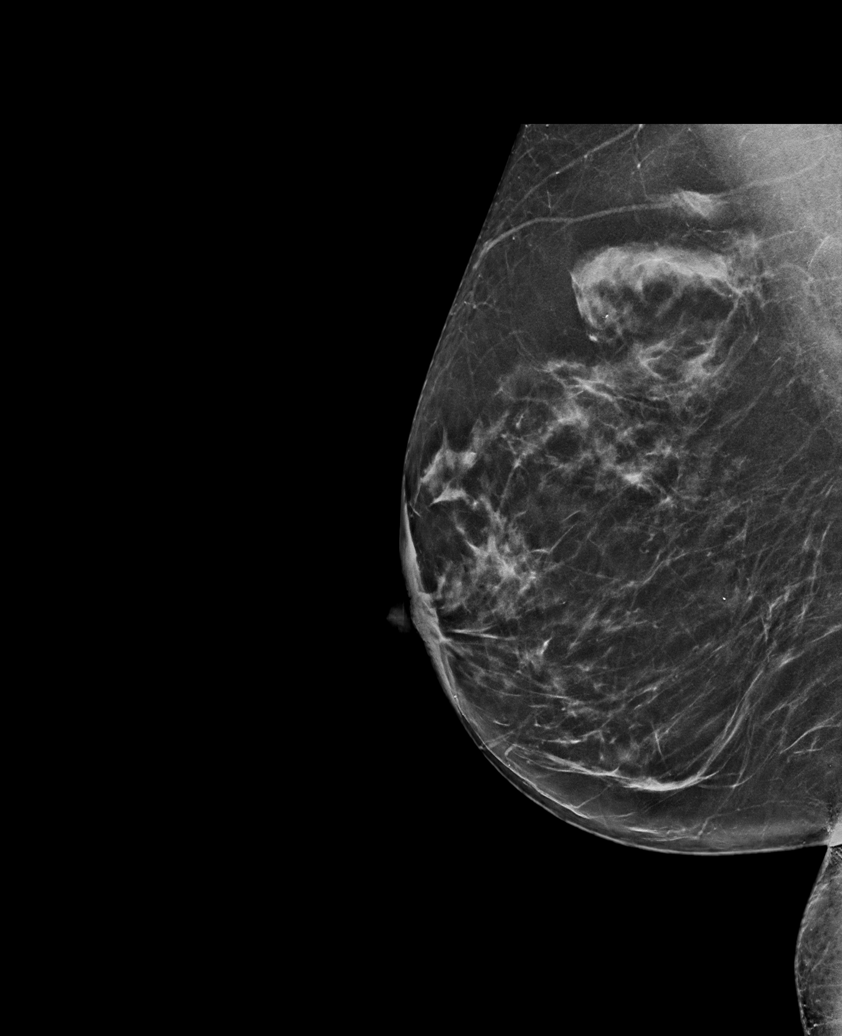

[L MLO tomo · tomo slice 37/74.0]
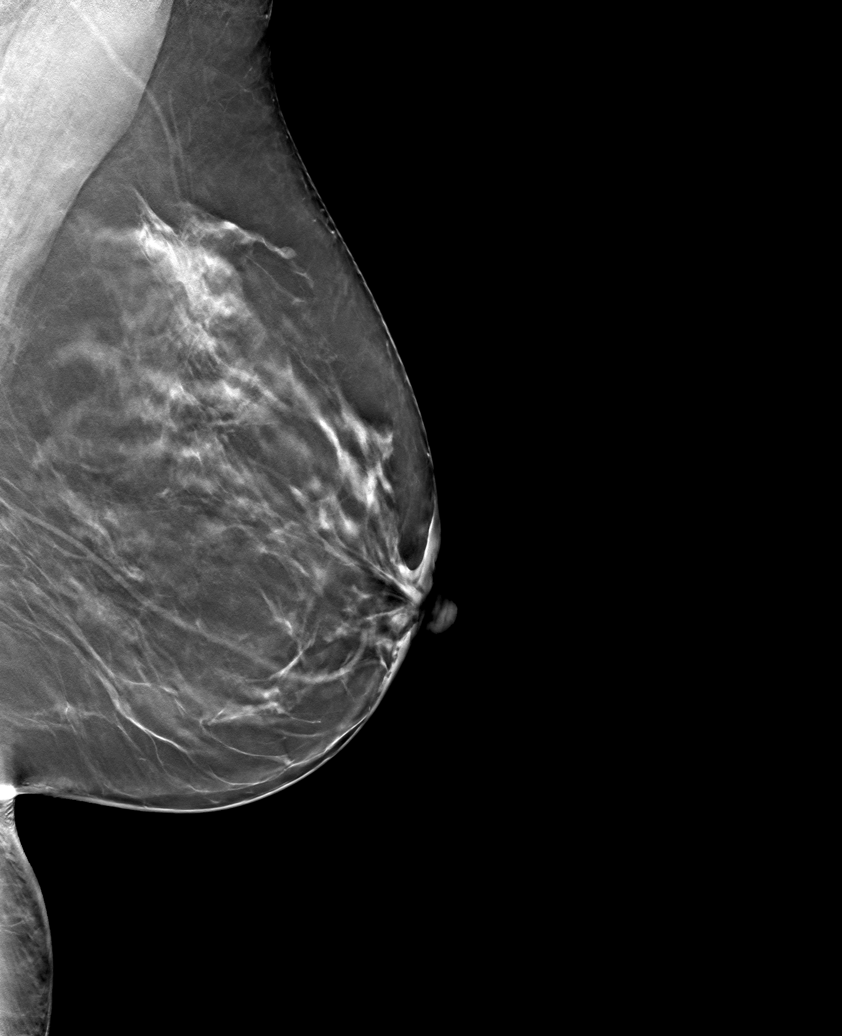

[6 of 25 positions shown; findings below may reference images not displayed]

ACR Breast Density Category b: There are scattered areas of
fibroglandular density.
FINDINGS: Mammogram:

Right breast: No suspicious mass, distortion, or microcalcifications
are identified to suggest presence of malignancy. There are post
reduction surgical changes in the inferior right breast.

Left breast:No suspicious mass, distortion, or microcalcifications
are identified to suggest presence of malignancy. There are post
reduction surgical changes in the inferior left breast.

Mammographic images were processed with CAD.

On physical exam I palpate thickening along the patient's scar in
the inferior medial aspect of the breast at her palpable site of
concern. No discrete fixed mass.

Ultrasound:

Targeted ultrasound is performed at 4 o'clock 9 cm from the nipple
demonstrating no discrete cystic or solid abnormality.
IMPRESSION: No mammographic or sonographic evidence of malignancy. At the
palpable site of concern in the right breast at 4 o'clock there is
no suspicious abnormality. The patient may be feeling scar tissue
from reduction mammoplasty.

RECOMMENDATION:
Screening mammogram in one year.(Code:04-J-H3S)

I have discussed the findings and recommendations with the patient.
If applicable, a reminder letter will be sent to the patient
regarding the next appointment.

BI-RADS CATEGORY  1: Negative.

## 2020-10-30 ENCOUNTER — Encounter: Payer: Self-pay | Admitting: Emergency Medicine

## 2020-10-30 ENCOUNTER — Ambulatory Visit
Admission: EM | Admit: 2020-10-30 | Discharge: 2020-10-30 | Disposition: A | Discharge status: Home / Self Care | Payer: Managed Care, Other (non HMO) | Attending: Emergency Medicine | Admitting: Emergency Medicine

## 2020-10-30 ENCOUNTER — Other Ambulatory Visit: Payer: Self-pay

## 2020-10-30 DIAGNOSIS — H6591 Unspecified nonsuppurative otitis media, right ear: Secondary | ICD-10-CM | POA: Diagnosis not present

## 2020-10-30 MED ORDER — FLUTICASONE PROPIONATE 50 MCG/ACT NA SUSP: 1.0000 | Freq: Every day | NASAL | 2 refills | Status: DC

## 2020-10-30 MED ORDER — CETIRIZINE HCL 10 MG PO TABS: 10.0000 mg | ORAL_TABLET | Freq: Every day | ORAL | 0 refills | Status: DC

## 2020-10-30 NOTE — Discharge Instructions (Addendum)
Use 1 spray of Flonase on 8 side for 1 week. Takes 10 mg of Zyrtec before bed.

## 2020-10-30 NOTE — ED Provider Notes (Signed)
Emergency Department Provider Note  ____________________________________________  Time seen: Approximately 12:00 PM  I have reviewed the triage vital signs and the nursing notes.   HISTORY  Chief Complaint Otalgia   Historian Patient     HPI Elizabeth Abbott is a 54 y.o. female presents to the urgent care with a sensation of right ear fullness and pruritus off and on for 4 weeks.  Patient has noticed some discharge when manipulating the external auditory canal.  No fever or chills.  No recent swimming.  She states that her hearing does feel muffled occasionally.  States that she has no pain with palpation of the tragus.  No other alleviating measures of been attempted.   History reviewed. No pertinent past medical history.   Immunizations up to date:  Yes.     History reviewed. No pertinent past medical history.  There are no problems to display for this patient.   Past Surgical History:  Procedure Laterality Date  . breast reduction    . CESAREAN SECTION  x2  . REDUCTION MAMMAPLASTY Bilateral 2005    Prior to Admission medications   Medication Sig Start Date End Date Taking? Authorizing Provider  amLODipine (NORVASC) 10 MG tablet Take 10 mg by mouth daily.    [provider]  benzonatate (TESSALON) 100 MG capsule Take 1 capsule (100 mg total) by mouth every 8 (eight) hours. Patient not taking: Reported on 10/30/2020 08/18/17   Vanessa Kick, MD  cetirizine (ZYRTEC ALLERGY) 10 MG tablet Take 1 tablet (10 mg total) by mouth daily for 10 days. 10/30/20 11/09/20  Lannie Fields, PA-C  chlorpheniramine-HYDROcodone (TUSSIONEX PENNKINETIC ER) 10-8 MG/5ML SUER Take 5 mLs by mouth 2 (two) times daily. Patient not taking: Reported on 10/30/2020 12/22/15   Roselee Culver, MD  fluticasone [facility]) 50 MCG/ACT nasal spray Place 1 spray into both nostrils daily for 7 days. 10/30/20 11/06/20  Lannie Fields, PA-C  ibuprofen (ADVIL,MOTRIN) 800 MG tablet Take 800 mg  by mouth every 8 (eight) hours as needed for mild pain or moderate pain.    [provider]  losartan (COZAAR) 100 MG tablet Take by mouth daily.     [provider]  valACYclovir (VALTREX) 500 MG tablet Take 500 mg by mouth 2 (two) times daily as needed (c[place]).     [provider]  zolpidem (AMBIEN) 5 MG tablet Take 2.5 mg by mouth at bedtime as needed for sleep.     [provider]    Allergies Penicillins and Percocet [oxycodone-acetaminophen]  Family History  Problem Relation Age of Onset  . Diabetes Other   . Heart failure Other   . Hypertension Other   . Breast cancer Maternal Aunt     Social History Social History   Tobacco Use  . Smoking status: Current Every Day Smoker    Packs/day: 0.50    Years: 20.00    Pack years: 10.00    Types: Cigarettes  . Smokeless tobacco: Never Used  Substance Use Topics  . Alcohol use: Yes    Alcohol/week: 0.0 standard drinks  . Drug use: No     Review of Systems  Constitutional: No fever/chills Eyes:  No discharge ENT: Patient has right ear pain.  Respiratory: no cough. No SOB/ use of accessory muscles to breath Gastrointestinal:   No nausea, no vomiting.  No diarrhea.  No constipation. Musculoskeletal: Negative for musculoskeletal pain. Skin: Negative for rash, abrasions, lacerations, ecchymosis.    ____________________________________________   PHYSICAL EXAM:  VITAL SIGNS: ED Triage Vitals  Enc Vitals Group     BP 10/30/20 1144 (!) 138/91     Pulse Rate 10/30/20 1144 76     Resp 10/30/20 1144 18     Temp 10/30/20 1144 98.6 F (37 C)     Temp Source 10/30/20 1144 Oral     SpO2 10/30/20 1144 97 %     Weight --      Height --      Head Circumference --      Peak Flow --      Pain Score 10/30/20 1143 5     Pain Loc --      Pain Edu? --      Excl. in [place]? --      Constitutional: Alert and oriented. Well appearing and in no acute distress. Eyes: Conjunctivae are normal.  PERRL. EOMI. Head: Atraumatic. ENT:      Ears: Right TM is effused.  No tenderness with palpation of the right tragus.  There is a small amount of wax in the external auditory canal but no purulent discharge.      Nose: No congestion/rhinnorhea.      Mouth/Throat: Mucous membranes are moist.  Neck: No stridor.  No cervical spine tenderness to palpation. Cardiovascular: Normal rate, regular rhythm. Normal S1 and S2.  Good peripheral circulation. Respiratory: Normal respiratory effort without tachypnea or retractions. Lungs CTAB. Good air entry to the bases with no decreased or absent breath sounds Musculoskeletal: Full range of motion to all extremities. No obvious deformities noted Neurologic:  Normal for age. No gross focal neurologic deficits are appreciated.  Skin:  Skin is warm, dry and intact. No rash noted. Psychiatric: Mood and affect are normal for age. Speech and behavior are normal.   ____________________________________________   LABS (all labs ordered are listed, but only abnormal results are displayed)  Labs Reviewed - No data to display ____________________________________________  EKG   ____________________________________________  RADIOLOGY   No results found.  ____________________________________________    PROCEDURES  Procedure(s) performed:     Procedures     Medications - No data to display   ____________________________________________   INITIAL IMPRESSION / ASSESSMENT AND PLAN / ED COURSE  Pertinent labs & imaging results that were available during my care of the patient were reviewed by me and considered in my medical decision making (see chart for details).      Assessment and plan Right ear pain 54 year old female presents to the urgent care for right ear pain that is occurred off and on for the past 4 weeks.  On physical exam, right TM appears effused without signs of otitis media.  There was no reproducible tenderness with  palpation of the right tragus and there was only a small amount of wax visualized at the entrance of the external auditory canal.  Suspect middle ear effusion without otitis media or TM rupture at this time.  We will treat with Zyrtec and Flonase daily for the next 7 to 10 days.  Recommended reassessment in 1 to 2 weeks if symptoms have not improved.  Patient felt comfortable with management plan.     ____________________________________________  FINAL CLINICAL IMPRESSION(S) / ED DIAGNOSES  Final diagnoses:  Fluid level behind tympanic membrane of right ear      NEW MEDICATIONS STARTED DURING THIS VISIT:  ED Discharge Orders         Ordered    fluticasone (FLONASE) 50 MCG/ACT nasal spray  Daily  10/30/20 1156    cetirizine (ZYRTEC ALLERGY) 10 MG tablet  Daily        10/30/20 1156              This chart was dictated using voice recognition software/Dragon. Despite best efforts to proofread, errors can occur which can change the meaning. Any change was purely unintentional.     Lannie Fields, PA-C 10/30/20 1204

## 2020-10-30 NOTE — ED Triage Notes (Signed)
Pt here for right ear pain and having drainage from right ear x 4 weeks

## 2021-07-01 DIAGNOSIS — F411 Generalized anxiety disorder: Secondary | ICD-10-CM | POA: Insufficient documentation

## 2021-07-01 DIAGNOSIS — F321 Major depressive disorder, single episode, moderate: Secondary | ICD-10-CM | POA: Insufficient documentation

## 2021-10-19 ENCOUNTER — Other Ambulatory Visit: Payer: Self-pay

## 2021-10-19 ENCOUNTER — Encounter (HOSPITAL_COMMUNITY): Payer: Self-pay | Admitting: *Deleted

## 2021-10-19 ENCOUNTER — Ambulatory Visit (HOSPITAL_COMMUNITY)
Admission: EM | Admit: 2021-10-19 | Discharge: 2021-10-19 | Disposition: A | Discharge status: Home / Self Care | Payer: Managed Care, Other (non HMO) | Attending: Internal Medicine | Admitting: Internal Medicine

## 2021-10-19 DIAGNOSIS — R109 Unspecified abdominal pain: Secondary | ICD-10-CM | POA: Diagnosis present

## 2021-10-19 LAB — POCT URINALYSIS DIPSTICK, ED / UC
Bilirubin Urine: NEGATIVE
Glucose, UA: NEGATIVE mg/dL
Hgb urine dipstick: NEGATIVE
Ketones, ur: NEGATIVE mg/dL
Leukocytes,Ua: NEGATIVE
Nitrite: NEGATIVE
Protein, ur: NEGATIVE mg/dL
Specific Gravity, Urine: 1.025 (ref 1.005–1.030)
Urobilinogen, UA: 0.2 mg/dL (ref 0.0–1.0)
pH: 6 (ref 5.0–8.0)

## 2021-10-19 MED ORDER — NAPROXEN 500 MG PO TABS: 500.0000 mg | ORAL_TABLET | Freq: Two times a day (BID) | ORAL | 0 refills | Status: AC

## 2021-10-19 MED ORDER — KETOROLAC TROMETHAMINE 30 MG/ML IJ SOLN
INTRAMUSCULAR | Status: AC
  Filled 2021-10-19: qty 1

## 2021-10-19 MED ORDER — CYCLOBENZAPRINE HCL 10 MG PO TABS: 10.0000 mg | ORAL_TABLET | Freq: Two times a day (BID) | ORAL | 0 refills | Status: AC | PRN

## 2021-10-19 MED ORDER — KETOROLAC TROMETHAMINE 30 MG/ML IJ SOLN
30.0000 mg | Freq: Once | INTRAMUSCULAR | Status: AC
  Administered 2021-10-19: 30 mg via INTRAVENOUS

## 2021-10-19 MED ORDER — NITROFURANTOIN MONOHYD MACRO 100 MG PO CAPS: 100.0000 mg | ORAL_CAPSULE | Freq: Two times a day (BID) | ORAL | 0 refills | Status: DC

## 2021-10-19 NOTE — ED Triage Notes (Signed)
Pt reports her kidney is broken. Pt points to back on the RT side. Pt reports pain started 5days ago.

## 2021-10-19 NOTE — Discharge Instructions (Signed)
Your urinalysis today did not show any signs of infection, it has been sent to the lab to see if it grows bacteria, if this occurs you will be notified if any changes need to be made.  Antibiotic  You may take Macrobid twice a day for the next 5 days  You may use naproxen twice a day as needed to help with pain  You may use muscle relaxer twice a day in addition as needed, be mindful this medication may make you drowsy, if this occurs you may take at bedtime  You can use heat over the affected area and 15-minute intervals, pillows for support in addition  Follow-up in urgent care as needed for persistent or worsening symptoms

## 2021-10-20 LAB — URINE CULTURE: Culture: NO GROWTH

## 2021-10-20 NOTE — ED Provider Notes (Signed)
[facility]    CSN: [id number] Arrival date & time: 10/19/21  1702      History   Chief Complaint Chief Complaint  Patient presents with   Flank Pain    HPI Elizabeth Abbott is a 55 y.o. female.   Patient presents with right-sided flank pain for 5 days.  Endorses symptoms have increased in intensity and can be felt at all times.  Worsened with movement such as twisting or turning.  Endorses symptoms feel similar to when she had a kidney infection in 2015.  Denies urinary frequency, urgency, hematuria, abdominal pain, discharge, vaginal itching or irritation, vaginal odor.  Has attempted use of over-the-counter medications to reduce symptoms with no relief.  History of hypertension.   History reviewed. No pertinent past medical history.  There are no problems to display for this patient.   Past Surgical History:  Procedure Laterality Date   breast reduction     CESAREAN SECTION  x2   REDUCTION MAMMAPLASTY Bilateral 2005    OB History   No obstetric history on file.      Home Medications    Prior to Admission medications   Medication Sig Start Date End Date Taking? Authorizing Provider  cyclobenzaprine (FLEXERIL) 10 MG tablet Take 1 tablet (10 mg total) by mouth 2 (two) times daily as needed for muscle spasms. 10/19/21  Yes Inda Mcglothen R, NP  naproxen (NAPROSYN) 500 MG tablet Take 1 tablet (500 mg total) by mouth 2 (two) times daily. 10/19/21  Yes Raniyah Curenton R, NP  nitrofurantoin, macrocrystal-monohydrate, (MACROBID) 100 MG capsule Take 1 capsule (100 mg total) by mouth 2 (two) times daily. 10/19/21  Yes Drucilla Cumber R, NP  amLODipine (NORVASC) 10 MG tablet Take 10 mg by mouth daily.    [provider]  benzonatate (TESSALON) 100 MG capsule Take 1 capsule (100 mg total) by mouth every 8 (eight) hours. Patient not taking: Reported on 10/30/2020 08/18/17   Vanessa Kick, MD  cetirizine (ZYRTEC ALLERGY) 10 MG tablet Take 1 tablet (10 mg  total) by mouth daily for 10 days. 10/30/20 11/09/20  Lannie Fields, PA-C  chlorpheniramine-HYDROcodone (TUSSIONEX PENNKINETIC ER) 10-8 MG/5ML SUER Take 5 mLs by mouth 2 (two) times daily. Patient not taking: Reported on 10/30/2020 12/22/15   Roselee Culver, MD  fluticasone [facility]) 50 MCG/ACT nasal spray Place 1 spray into both nostrils daily for 7 days. 10/30/20 11/06/20  Lannie Fields, PA-C  ibuprofen (ADVIL,MOTRIN) 800 MG tablet Take 800 mg by mouth every 8 (eight) hours as needed for mild pain or moderate pain.    [provider]  losartan (COZAAR) 100 MG tablet Take by mouth daily.     [provider]  valACYclovir (VALTREX) 500 MG tablet Take 500 mg by mouth 2 (two) times daily as needed (c[place]).     [provider]  zolpidem (AMBIEN) 5 MG tablet Take 2.5 mg by mouth at bedtime as needed for sleep.     [provider]    Family History Family History  Problem Relation Age of Onset   Breast cancer Maternal Aunt    Diabetes Other    Heart failure Other    Hypertension Other     Social History Social History   Tobacco Use   Smoking status: Every Day    Packs/day: 0.50    Years: 20.00    Pack years: 10.00    Types: Cigarettes   Smokeless tobacco: Never  Substance Use Topics  Alcohol use: Yes    Alcohol/week: 0.0 standard drinks   Drug use: No     Allergies   Penicillins and Percocet [oxycodone-acetaminophen]   Review of Systems Review of Systems  HENT: Negative.    Respiratory: Negative.    Cardiovascular: Negative.   Gastrointestinal: Negative.   Genitourinary:  Positive for flank pain. Negative for decreased urine volume, difficulty urinating, dyspareunia, dysuria, enuresis, frequency, genital sores, hematuria, menstrual problem, pelvic pain, urgency, vaginal bleeding, vaginal discharge and vaginal pain.  Skin: Negative.   Neurological: Negative.     Physical Exam Triage Vital Signs ED Triage Vitals  Enc Vitals  Group     BP 10/19/21 1819 139/78     Pulse Rate 10/19/21 1819 71     Resp 10/19/21 1819 18     Temp 10/19/21 1819 98.8 F (37.1 C)     Temp src --      SpO2 10/19/21 1819 96 %     Weight --      Height --      Head Circumference --      Peak Flow --      Pain Score 10/19/21 1817 10     Pain Loc --      Pain Edu? --      Excl. in [place]? --    No data found.  Updated Vital Signs BP 139/78   Pulse 71   Temp 98.8 F (37.1 C)   Resp 18   LMP 06/05/2021   SpO2 96%   Visual Acuity Right Eye Distance:   Left Eye Distance:   Bilateral Distance:    Right Eye Near:   Left Eye Near:    Bilateral Near:     Physical Exam Constitutional:      Appearance: Normal appearance. She is normal weight.  HENT:     Head: Normocephalic.  Eyes:     Extraocular Movements: Extraocular movements intact.  Pulmonary:     Effort: Pulmonary effort is normal.  Abdominal:     General: Abdomen is flat. Bowel sounds are normal. There is no distension.     Palpations: Abdomen is soft.     Tenderness: There is no abdominal tenderness. There is right CVA tenderness. There is no left CVA tenderness.  Skin:    General: Skin is warm and dry.  Neurological:     Mental Status: She is alert and oriented to person, place, and time. Mental status is at baseline.  Psychiatric:        Mood and Affect: Mood normal.        Behavior: Behavior normal.     UC Treatments / Results  Labs (all labs ordered are listed, but only abnormal results are displayed) Labs Reviewed  URINE CULTURE  POCT URINALYSIS DIPSTICK, ED / UC    EKG   Radiology No results found.  Procedures Procedures (including critical care time)  Medications Ordered in UC Medications  ketorolac (TORADOL) 30 MG/ML injection 30 mg (30 mg Intravenous Given 10/19/21 1856)    Initial Impression / Assessment and Plan / UC Course  I have reviewed the triage vital signs and the nursing notes.  Pertinent labs & imaging results that were  available during my care of the patient were reviewed by me and considered in my medical decision making (see chart for details).  Right flank pain  1.  Urinalysis negative, sent for culture 2.  Macrobid 100 mg twice daily for 5 days, will treat prophylactically while culture is pending  3.  Discussed with patient etiology of symptoms may be more muscular, will treat muscular pain as well, naproxen 500 mg twice daily as needed 4.  Flexeril 10 mg twice daily as needed 5.  Heating pad over affected area, daily stretching, pillows for support 6.  Follow-up in urgent care as needed Final Clinical Impressions(s) / UC Diagnoses   Final diagnoses:  Right flank pain     Discharge Instructions      Your urinalysis today did not show any signs of infection, it has been sent to the lab to see if it grows bacteria, if this occurs you will be notified if any changes need to be made.  Antibiotic  You may take Macrobid twice a day for the next 5 days  You may use naproxen twice a day as needed to help with pain  You may use muscle relaxer twice a day in addition as needed, be mindful this medication may make you drowsy, if this occurs you may take at bedtime  You can use heat over the affected area and 15-minute intervals, pillows for support in addition  Follow-up in urgent care as needed for persistent or worsening symptoms   ED Prescriptions     Medication Sig Dispense Auth. Provider   nitrofurantoin, macrocrystal-monohydrate, (MACROBID) 100 MG capsule Take 1 capsule (100 mg total) by mouth 2 (two) times daily. 10 capsule Amritpal Shropshire R, NP   naproxen (NAPROSYN) 500 MG tablet Take 1 tablet (500 mg total) by mouth 2 (two) times daily. 30 tablet Luchiano Viscomi R, NP   cyclobenzaprine (FLEXERIL) 10 MG tablet Take 1 tablet (10 mg total) by mouth 2 (two) times daily as needed for muscle spasms. 20 tablet Hans Eden, NP      PDMP not reviewed this encounter.   Hans Eden,  [place] 10/20/21 [phone]

## 2021-10-23 DIAGNOSIS — M5030 Other cervical disc degeneration, unspecified cervical region: Secondary | ICD-10-CM | POA: Insufficient documentation

## 2021-10-23 DIAGNOSIS — M47816 Spondylosis without myelopathy or radiculopathy, lumbar region: Secondary | ICD-10-CM | POA: Insufficient documentation

## 2021-10-23 DIAGNOSIS — G8929 Other chronic pain: Secondary | ICD-10-CM | POA: Insufficient documentation

## 2021-12-30 LAB — HM PAP SMEAR: HM Pap smear: NORMAL

## 2022-01-22 DIAGNOSIS — M4802 Spinal stenosis, cervical region: Secondary | ICD-10-CM | POA: Insufficient documentation

## 2023-08-26 ENCOUNTER — Emergency Department (HOSPITAL_COMMUNITY): Payer: Managed Care, Other (non HMO)

## 2023-08-26 ENCOUNTER — Encounter (HOSPITAL_COMMUNITY): Payer: Self-pay

## 2023-08-26 ENCOUNTER — Emergency Department (HOSPITAL_COMMUNITY)
Admission: EM | Admit: 2023-08-26 | Discharge: 2023-08-26 | Disposition: A | Discharge status: Home / Self Care | Payer: Managed Care, Other (non HMO) | Attending: Emergency Medicine | Admitting: Emergency Medicine

## 2023-08-26 ENCOUNTER — Other Ambulatory Visit: Payer: Self-pay

## 2023-08-26 DIAGNOSIS — R059 Cough, unspecified: Secondary | ICD-10-CM | POA: Insufficient documentation

## 2023-08-26 DIAGNOSIS — R0981 Nasal congestion: Secondary | ICD-10-CM | POA: Insufficient documentation

## 2023-08-26 DIAGNOSIS — J069 Acute upper respiratory infection, unspecified: Secondary | ICD-10-CM

## 2023-08-26 DIAGNOSIS — Z1152 Encounter for screening for COVID-19: Secondary | ICD-10-CM | POA: Insufficient documentation

## 2023-08-26 DIAGNOSIS — I10 Essential (primary) hypertension: Secondary | ICD-10-CM | POA: Insufficient documentation

## 2023-08-26 DIAGNOSIS — Z79899 Other long term (current) drug therapy: Secondary | ICD-10-CM | POA: Insufficient documentation

## 2023-08-26 DIAGNOSIS — J9801 Acute bronchospasm: Secondary | ICD-10-CM

## 2023-08-26 DIAGNOSIS — J029 Acute pharyngitis, unspecified: Secondary | ICD-10-CM | POA: Insufficient documentation

## 2023-08-26 DIAGNOSIS — J3489 Other specified disorders of nose and nasal sinuses: Secondary | ICD-10-CM | POA: Diagnosis not present

## 2023-08-26 DIAGNOSIS — F419 Anxiety disorder, unspecified: Secondary | ICD-10-CM | POA: Insufficient documentation

## 2023-08-26 LAB — BASIC METABOLIC PANEL
Anion gap: 11 (ref 5–15)
BUN: 11 mg/dL (ref 6–20)
CO2: 24 mmol/L (ref 22–32)
Calcium: 8.6 mg/dL — ABNORMAL LOW (ref 8.9–10.3)
Chloride: 100 mmol/L (ref 98–111)
Creatinine, Ser: 0.56 mg/dL (ref 0.44–1.00)
GFR, Estimated: >60 mL/min (ref >60)
Glucose, Bld: 100 mg/dL — ABNORMAL HIGH (ref 70–99)
Potassium: 3 mmol/L — ABNORMAL LOW (ref 3.5–5.1)
Sodium: 135 mmol/L (ref 135–145)

## 2023-08-26 LAB — CBC WITH DIFFERENTIAL/PLATELET
Abs Immature Granulocytes: 0.04 10*3/uL (ref 0.00–0.07)
Basophils Absolute: 0.1 10*3/uL (ref 0.0–0.1)
Basophils Relative: 1 %
Eosinophils Absolute: 0 10*3/uL (ref 0.0–0.5)
Eosinophils Relative: 0 %
HCT: 42 % (ref 36.0–46.0)
Hemoglobin: 14.4 g/dL (ref 12.0–15.0)
Immature Granulocytes: 1 %
Lymphocytes Relative: 16 %
Lymphs Abs: 1.4 10*3/uL (ref 0.7–4.0)
MCH: 32.2 pg (ref 26.0–34.0)
MCHC: 34.3 g/dL (ref 30.0–36.0)
MCV: 94 fL (ref 80.0–100.0)
Monocytes Absolute: 0.9 10*3/uL (ref 0.1–1.0)
Monocytes Relative: 10 %
Neutro Abs: 6.4 10*3/uL (ref 1.7–7.7)
Neutrophils Relative %: 72 %
Platelets: 221 10*3/uL (ref 150–400)
RBC: 4.47 MIL/uL (ref 3.87–5.11)
RDW: 14.3 % (ref 11.5–15.5)
WBC: 8.9 10*3/uL (ref 4.0–10.5)
nRBC: 0 % (ref 0.0–0.2)

## 2023-08-26 LAB — TROPONIN I (HIGH SENSITIVITY): Troponin I (High Sensitivity): 3 ng/L (ref <18)

## 2023-08-26 LAB — SARS CORONAVIRUS 2 BY RT PCR: SARS Coronavirus 2 by RT PCR: NEGATIVE

## 2023-08-26 MED ORDER — LIDOCAINE VISCOUS HCL 2 % MT SOLN
15.0000 mL | Freq: Once | OROMUCOSAL | Status: AC
  Administered 2023-08-26: 15 mL via OROMUCOSAL
  Filled 2023-08-26: qty 15

## 2023-08-26 MED ORDER — SODIUM CHLORIDE (PF) 0.9 % IJ SOLN
INTRAMUSCULAR | Status: AC
  Filled 2023-08-26: qty 50

## 2023-08-26 MED ORDER — POTASSIUM CHLORIDE CRYS ER 20 MEQ PO TBCR
40.0000 meq | EXTENDED_RELEASE_TABLET | Freq: Once | ORAL | Status: AC
  Administered 2023-08-26: 40 meq via ORAL
  Filled 2023-08-26: qty 2

## 2023-08-26 MED ORDER — MAGIC MOUTHWASH: 15.0000 mL | Freq: Three times a day (TID) | ORAL | 0 refills | Status: AC

## 2023-08-26 MED ORDER — ALBUTEROL SULFATE HFA 108 (90 BASE) MCG/ACT IN AERS
2.0000 | INHALATION_SPRAY | Freq: Once | RESPIRATORY_TRACT | Status: AC
  Administered 2023-08-26: 2 via RESPIRATORY_TRACT
  Filled 2023-08-26: qty 6.7

## 2023-08-26 MED ORDER — GUAIFENESIN 100 MG/5ML PO LIQD: 100.0000 mg | ORAL | 0 refills | Status: DC | PRN

## 2023-08-26 MED ORDER — GUAIFENESIN 100 MG/5ML PO LIQD
5.0000 mL | Freq: Once | ORAL | Status: AC
  Administered 2023-08-26: 5 mL via ORAL
  Filled 2023-08-26: qty 10

## 2023-08-26 MED ORDER — ALUM & MAG HYDROXIDE-SIMETH 200-200-20 MG/5ML PO SUSP
30.0000 mL | Freq: Once | ORAL | Status: AC
  Administered 2023-08-26: 30 mL via ORAL
  Filled 2023-08-26: qty 30

## 2023-08-26 MED ORDER — IOHEXOL 300 MG/ML  SOLN
75.0000 mL | Freq: Once | INTRAMUSCULAR | Status: AC | PRN
  Administered 2023-08-26: 75 mL via INTRAVENOUS

## 2023-08-26 NOTE — Discharge Instructions (Addendum)
This is likely a viral illness causing your cough, bronchospasm and congestion.  Keep yourself hydrated.  Use Tylenol or Motrin as needed for aches and for fever.  Follow-up with your doctor.  Return to the ED with exertional chest pain, pain associate with shortness of breath, nausea, vomiting, sweating or any concerns.

## 2023-08-26 NOTE — ED Triage Notes (Signed)
BIBA - pt states she was blowing her nose and coughed at the same time. Pt states she feels like the mucous is stuck in her throat. Pt is very anxious. 99% on RA

## 2023-08-26 NOTE — ED Notes (Signed)
Results discussed with patient by EDP provider. All questions answered. Discharge instructions, follow up information, and prescriptions reviewed with patient. Patient verbalized understanding. No acute distress noted. Escorted to ED lobby for dispo. Stable condition.

## 2023-08-26 NOTE — ED Notes (Signed)
Pt to CT scan.

## 2023-08-26 NOTE — ED Notes (Signed)
Pt ambutating had a pulse of 112 and SpO2 of 97%.

## 2023-08-26 NOTE — ED Provider Notes (Signed)
  Physical Exam  BP (!) 149/87   Pulse (!) 105   Temp 99.1 F (37.3 C) (Oral)   Resp 18   Ht 5\' 1"  (1.549 m)   Wt 83.9 kg   SpO2 96%   BMI 34.96 kg/m   Physical Exam  Procedures  Procedures  ED Course / MDM    Medical Decision Making Amount and/or Complexity of Data Reviewed Labs: ordered. Radiology: ordered. ECG/medicine tests: ordered.  Risk OTC drugs. Prescription drug management.   I had assumed care of this patient from Dr. Manus Gunning. Patient appears well.  She indicates that she calls and then starts having difficulty in breathing.  She feels like something is stuck in her throat.  CT soft tissue neck was ordered, which is negative for acute findings, deep space infection.  I suspect she is having bronchospasms.  We will give her albuterol, something for cough. Patient not sure if she wants Magic mouthwash-but it too is prescribed.  Strict ER return precautions have been discussed, and patient is agreeing with the plan and is comfortable with the workup done and the recommendations from the ER.        Derwood Kaplan, MD 08/26/23 [phone]

## 2023-08-26 NOTE — ED Provider Notes (Signed)
[place] EMERGENCY DEPARTMENT AT [facility] Provider Note   CSN: [id number] Arrival date & time: 08/26/23  0354     History  Chief Complaint  Patient presents with   Mucous Stuck in Throat    Elizabeth Abbott is a 57 y.o. female.  Patient with history of hypertension presenting with URI symptoms for the past 1 day.  States developed congestion, sore throat, coughing throughout the day yesterday.  She woke up from sleep tonight and was going to the bathroom.  She was blowing her nose, coughing and sneezing at the same time and believes she has some mucus "come up and then go back down the wrong way".  She feels there is mucus stuck in her throat and chest.  She feels like she needs to cough up something but cannot.  Does feel short of breath.  Some discomfort with swallowing.  Chest pain only with coughing.  Was able to cough up some clear mucus at home but not able to cough up anything now.  Has been blowing her nose as well and having facial and sinus congestion.  Multiple sick contacts at work.   The history is provided by the patient.       Home Medications Prior to Admission medications   Medication Sig Start Date End Date Taking? Authorizing Provider  amLODipine (NORVASC) 10 MG tablet Take 10 mg by mouth daily.    [provider]  benzonatate (TESSALON) 100 MG capsule Take 1 capsule (100 mg total) by mouth every 8 (eight) hours. Patient not taking: Reported on 10/30/2020 08/18/17   Mardella Layman, MD  cetirizine (ZYRTEC ALLERGY) 10 MG tablet Take 1 tablet (10 mg total) by mouth daily for 10 days. 10/30/20 11/09/20  Orvil Feil, PA-C  chlorpheniramine-HYDROcodone (TUSSIONEX PENNKINETIC ER) 10-8 MG/5ML SUER Take 5 mLs by mouth 2 (two) times daily. Patient not taking: Reported on 10/30/2020 12/22/15   Carmelina Dane, MD  cyclobenzaprine (FLEXERIL) 10 MG tablet Take 1 tablet (10 mg total) by mouth 2 (two) times daily as needed for muscle spasms. 10/19/21    White, Elita Boone, NP  fluticasone (FLONASE) 50 MCG/ACT nasal spray Place 1 spray into both nostrils daily for 7 days. 10/30/20 11/06/20  Orvil Feil, PA-C  ibuprofen (ADVIL,MOTRIN) 800 MG tablet Take 800 mg by mouth every 8 (eight) hours as needed for mild pain or moderate pain.    [provider]  losartan (COZAAR) 100 MG tablet Take by mouth daily.     [provider]  naproxen (NAPROSYN) 500 MG tablet Take 1 tablet (500 mg total) by mouth 2 (two) times daily. 10/19/21   White, Elita Boone, NP  nitrofurantoin, macrocrystal-monohydrate, (MACROBID) 100 MG capsule Take 1 capsule (100 mg total) by mouth 2 (two) times daily. 10/19/21   White, Elita Boone, NP  valACYclovir (VALTREX) 500 MG tablet Take 500 mg by mouth 2 (two) times daily as needed (c[place]).     [provider]  zolpidem (AMBIEN) 5 MG tablet Take 2.5 mg by mouth at bedtime as needed for sleep.     [provider]      Allergies    Penicillins and Percocet [oxycodone-acetaminophen]    Review of Systems   Review of Systems  Constitutional:  Negative for activity change, appetite change, fatigue and fever.  HENT:  Positive for congestion, rhinorrhea and sore throat.   Respiratory:  Positive for cough. Negative for shortness of breath.   Cardiovascular:  Negative for chest  pain.  Gastrointestinal:  Positive for nausea. Negative for abdominal pain.  Genitourinary:  Negative for dysuria and hematuria.  Musculoskeletal:  Negative for arthralgias and myalgias.  Skin:  Negative for rash.  Neurological:  Negative for dizziness, weakness and headaches.   all other systems are negative except as noted in the HPI and PMH.    Physical Exam Updated Vital Signs BP (!) 153/97 (BP Location: Right Arm)   Pulse (!) 101   Temp 99.8 F (37.7 C) (Oral)   Resp 16   Ht 5\' 1"  (1.549 m)   Wt 83.9 kg   SpO2 98%   BMI 34.96 kg/m  Physical Exam Vitals and nursing note reviewed.  Constitutional:       General: She is not in acute distress.    Appearance: She is well-developed.     Comments: anxious  HENT:     Head: Normocephalic and atraumatic.     Nose: Congestion and rhinorrhea present.     Mouth/Throat:     Pharynx: No oropharyngeal exudate.     Comments: OP clear Eyes:     Conjunctiva/sclera: Conjunctivae normal.     Pupils: Pupils are equal, round, and reactive to light.  Neck:     Comments: No meningismus. Cardiovascular:     Rate and Rhythm: Normal rate and regular rhythm.     Heart sounds: Normal heart sounds. No murmur heard. Pulmonary:     Effort: Pulmonary effort is normal. No respiratory distress.     Breath sounds: Normal breath sounds. No wheezing.  Abdominal:     Palpations: Abdomen is soft.     Tenderness: There is no abdominal tenderness. There is no guarding or rebound.  Musculoskeletal:        General: No tenderness. Normal range of motion.     Cervical back: Normal range of motion and neck supple.  Skin:    General: Skin is warm.  Neurological:     Mental Status: She is alert and oriented to person, place, and time.     Cranial Nerves: No cranial nerve deficit.     Motor: No abnormal muscle tone.     Coordination: Coordination normal.     Comments:  5/5 strength throughout. CN 2-12 intact.Equal grip strength.   Psychiatric:        Behavior: Behavior normal.     ED Results / Procedures / Treatments   Labs (all labs ordered are listed, but only abnormal results are displayed) Labs Reviewed  SARS CORONAVIRUS 2 BY RT PCR  CBC WITH DIFFERENTIAL/PLATELET  BASIC METABOLIC PANEL  TROPONIN I (HIGH SENSITIVITY)    EKG None  Radiology DG Neck Soft Tissue  Result Date: 08/26/2023 CLINICAL DATA:  Cough with cold for 3 days. Mucous stuck in the throat. EXAM: NECK SOFT TISSUES - 1+ VIEW COMPARISON:  None Available. FINDINGS: No visible air column above the glottis. This is at least partially due to apposition but swelling is not excluded. Gas in the lower  prevertebral region is likely within the esophagus. Cannot assess upper retropharyngeal tissues given absence of adjacent air. Ordinary cervical spine degeneration. IMPRESSION: No visible air column above the glottis, recommend neck CT with contrast. Electronically Signed   By: Tiburcio Pea M.D.   On: 08/26/2023 05:54   DG Chest 2 View  Result Date: 08/26/2023 CLINICAL DATA:  57 year old female with history of cough. EXAM: CHEST - 2 VIEW COMPARISON:  Chest x-ray 06/09/2015. FINDINGS: Lung volumes are normal. No consolidative airspace disease. No pleural effusions.  No pneumothorax. No pulmonary nodule or mass noted. Pulmonary vasculature and the cardiomediastinal silhouette are within normal limits. IMPRESSION: No radiographic evidence of acute cardiopulmonary disease. Electronically Signed   By: Trudie Reed M.D.   On: 08/26/2023 05:46    Procedures Procedures    Medications Ordered in ED Medications  alum & mag hydroxide-simeth (MAALOX/MYLANTA) 200-200-20 MG/5ML suspension 30 mL (30 mLs Oral Given 08/26/23 0444)  lidocaine (XYLOCAINE) 2 % viscous mouth solution 15 mL (15 mLs Mouth/Throat Given 08/26/23 0444)    ED Course/ Medical Decision Making/ A&P                                 Medical Decision Making Amount and/or Complexity of Data Reviewed Independent Historian: EMS Labs: ordered. Decision-making details documented in ED Course. Radiology: ordered and independent interpretation performed. Decision-making details documented in ED Course. ECG/medicine tests: ordered and independent interpretation performed. Decision-making details documented in ED Course.  Risk OTC drugs. Prescription drug management.   URI symptoms for 1 day now with difficulty clearing mucus from her throat.  No respiratory distress.  Oropharynx is clear.  Clear lungs.  No hypoxia.  Will obtain COVID testing, chest x-ray as well as soft tissue neck x-ray given her concern for foreign body sensation in her  throat.  She is in no respiratory distress and has no hypoxia with clear lungs.  COVID test is negative.  Chest x-ray negative for pneumonia or pneumothorax.  Results reviewed and interpreted by me.  Soft tissue neck x-ray does show lack of air above the glottis.  CT scan recommended per radiology.  Discussed with patient.  Suspect likely mucous plug causing her symptoms.  She is in no respiratory distress.  CT pending at shift change.  Anticipate supportive care for likely viral URI if results are reassuring. Dr. Rhunette Croft to assume care.         Final Clinical Impression(s) / ED Diagnoses Final diagnoses:  None    Rx / DC Orders ED Discharge Orders     None         Mylie Mccurley, Jeannett Senior, MD 08/26/23 [phone]

## 2023-10-18 ENCOUNTER — Ambulatory Visit (INDEPENDENT_AMBULATORY_CARE_PROVIDER_SITE_OTHER): Payer: Managed Care, Other (non HMO) | Admitting: Allergy & Immunology

## 2023-10-18 ENCOUNTER — Other Ambulatory Visit: Payer: Self-pay

## 2023-10-18 ENCOUNTER — Encounter: Payer: Self-pay | Admitting: Allergy & Immunology

## 2023-10-18 VITALS — BP 122/78 | HR 92 | Temp 98.3°F | Resp 14 | Ht 62.0 in | Wt 187.5 lb

## 2023-10-18 DIAGNOSIS — R221 Localized swelling, mass and lump, neck: Secondary | ICD-10-CM | POA: Diagnosis not present

## 2023-10-18 DIAGNOSIS — H6591 Unspecified nonsuppurative otitis media, right ear: Secondary | ICD-10-CM | POA: Diagnosis not present

## 2023-10-18 DIAGNOSIS — R220 Localized swelling, mass and lump, head: Secondary | ICD-10-CM

## 2023-10-18 NOTE — Progress Notes (Signed)
NEW PATIENT  Date of Service/Encounter:  10/18/23  Consult requested by: Bernadette Hoit, MD   Assessment:   Swelling of lip, tongue, and throat  Recurrent right OME   Possible inducible laryngeal obstruction - consider referral to ENT  Consider spiro at the next visit (patient was given albuterol at the ED visit)  Plan/Recommendations:   1. Swelling of lip, tongue, and throat - We cannot do testing today because insurance companies are not allowing testing on the same day as the first visit. - Stop your antihistamines (if you take any) for 3 days before this. - We will test at the next visit. - Consider vocal cord dysfunction as a diagnosis - Voice exercises provided to try next time that this happens. - We can send you to ENT for an evaluation as well, depending on the allergy testing results.   2. Return in about 2 weeks (around 11/01/2023) for SKIN TESTING. You can have the follow up appointment with Dr. Dellis Anes or a Nurse Practicioner (our Nurse Practitioners are excellent and always have Physician oversight!).    This note in its entirety was forwarded to the Provider who requested this consultation.  Subjective:   Elizabeth Abbott is a 57 y.o. female presenting today for evaluation of  Chief Complaint  Patient presents with   Allergies    States that her throat started closing up and had to stay the night in [place]. Has happened 2x since then. She believes it might be something she's eating or something in the air. Can't swallow or take a sip of water when this starts. She's been prescribed Trazodone to help with it. It helps keep her calm and get through it. Tends to happen hours after she eats a meal.    Elizabeth Abbott has a history of the following: There are no problems to display for this patient.   History obtained from: chart review and patient.  Discussed the use of AI scribe software for clinical note transcription with the patient and/or  guardian, who gave verbal consent to proceed.  [name] was referred by Bernadette Hoit, MD.     [name] is a 57 y.o. female presenting for an evaluation of a concern for a food allergy .  The patient, with a history of COVID-19 infection in November 2020, presents with recurrent episodes of throat constriction, leading to difficulty breathing. The initial episode occurred approximately a month ago, following a cold that resulted in a hospital admission. During this episode, the patient experienced severe throat constriction, necessitating emergency medical attention. A CT scan was performed that showed no apparent damage. The patient was treated with a numbing agent for the throat and discharged with an inhaler.  Since the initial episode, the patient has experienced at least two more similar episodes, even after the resolution of the cold. The patient also recalls a similar episode occurring in 2018, while they were at work, but without any preceding illness. During these episodes, the patient describes a sensation of their throat closing up, leading to difficulty breathing, described as "sips of air." The patient denies any associated fever, hives, or itching during these episodes.  The patient has been trying to identify potential triggers for these episodes, suspecting a possible environmental or food allergen. They noted having salad and scrambled eggs before the last two episodes. The patient denies any known allergies but is interested in allergy testing to rule out this possibility.  The patient also mentioned a history of  fluid accumulation in the ears during sinus or cold episodes. However, they deny frequent sinus or ear infections. The patient works as a Systems analyst and denies significant work-related stress or anxiety. They have been taking Trazodone, which seems to help them stay calm during these episodes. This is prescribed to her husband.     She did have a chest CT  that was negative. She does not remember a fever. She was worn out when she left, but she was improved. She has improved from a cold perspective. This helped with the coughing part.    Otherwise, there is no history of other atopic diseases, including drug allergies, stinging insect allergies, or contact dermatitis. There is no significant infectious history. Vaccinations are up to date.    Past Medical History: There are no problems to display for this patient.   Medication List:  Allergies as of 10/18/2023       Reactions   Codeine Swelling, Anxiety, Hives   Other reaction(s): Anxiety, Hives  Other reaction(s): Anxiety, Hives Other reaction(s): Anxiety, Hives, Other reaction(s): Anxiety, Hives   Oxycodone Swelling, Anxiety   Amitriptyline Other (See Comments)   Too drowsy   Penicillins    welts   Percocet [oxycodone-acetaminophen] Itching        Medication List        Accurate as of October 18, 2023  1:16 PM. If you have any questions, ask your nurse or doctor.          amLODipine 10 MG tablet Commonly known as: NORVASC Take 10 mg by mouth daily.   azelastine 0.1 % nasal spray Commonly known as: ASTELIN Place 1 spray into both nostrils 2 (two) times daily.   cyclobenzaprine 10 MG tablet Commonly known as: FLEXERIL Take 1 tablet (10 mg total) by mouth 2 (two) times daily as needed for muscle spasms.   guaiFENesin 100 MG/5ML liquid Commonly known as: ROBITUSSIN Take 5-10 mLs (100-200 mg total) by mouth every 4 (four) hours as needed for cough or to loosen phlegm.   hydrochlorothiazide 25 MG tablet Commonly known as: HYDRODIURIL Take 25 mg by mouth daily.   ibuprofen 800 MG tablet Commonly known as: ADVIL Take 800 mg by mouth every 8 (eight) hours as needed for mild pain or moderate pain.   magic mouthwash Soln Take 15 mLs by mouth 3 (three) times daily.   methylPREDNISolone 4 MG Tbpk tablet Commonly known as: MEDROL DOSEPAK Take 4 mg by mouth See  admin instructions. follow package directions   mometasone 0.1 % cream Commonly known as: ELOCON Apply 1 Application topically as needed (for ear itch).   naproxen 500 MG tablet Commonly known as: NAPROSYN Take 1 tablet (500 mg total) by mouth 2 (two) times daily.   potassium chloride SA 20 MEQ tablet Commonly known as: KLOR-CON M Take 20 mEq by mouth daily.   valACYclovir 500 MG tablet Commonly known as: VALTREX Take 500 mg by mouth 2 (two) times daily as needed ([place]).   zolpidem 5 MG tablet Commonly known as: AMBIEN Take 2.5 mg by mouth at bedtime as needed for sleep.        Birth History: non-contributory  Developmental History: non-contributory  Past Surgical History: Past Surgical History:  Procedure Laterality Date   breast reduction     CESAREAN SECTION  x2   REDUCTION MAMMAPLASTY Bilateral 2005     Family History: Family History  Problem Relation Age of Onset   Allergic rhinitis Mother    Breast cancer Maternal  Aunt    Diabetes Other    Heart failure Other    Hypertension Other      Social History: Foye lives at home with her family.  She works as a Tax adviser for [employer].  She was in a house that was built in 1960.  They live in a home with laminate flooring throughout as well as carpeting in the bedroom.  They have electric heating and central cooling.  There are no indoor outdoor animals.  There are no dust mite covers on bedding.  There is no tobacco exposure.  She does not use a HEPA filter.  She is not exposed to fumes, chemicals, or dust.  She does not live near an interstate or industrial area.  She was a smoker, for 7 or 8 years, but she stopped.   Review of systems otherwise negative other than that mentioned in the HPI.    Objective:   Blood pressure 122/78, pulse 92, temperature 98.3 F (36.8 C), temperature source Temporal, resp. rate 14, height 5\' 2"  (1.575 m), weight 187 lb 8 oz (85 kg), SpO2 99%. Body mass index is  34.29 kg/m.     Physical Exam Vitals reviewed.  Constitutional:      Appearance: She is well-developed.     Comments: Lovely and talkative.   HENT:     Head: Normocephalic and atraumatic.     Right Ear: Tympanic membrane, ear canal and external ear normal. No drainage, swelling or tenderness. Tympanic membrane is not injected, scarred, erythematous, retracted or bulging.     Left Ear: Tympanic membrane, ear canal and external ear normal. No drainage, swelling or tenderness. Tympanic membrane is not injected, scarred, erythematous, retracted or bulging.     Nose: No nasal deformity, septal deviation, mucosal edema or rhinorrhea.     Right Turbinates: Swollen and pale.     Left Turbinates: Swollen and pale.     Right Sinus: No maxillary sinus tenderness or frontal sinus tenderness.     Left Sinus: No maxillary sinus tenderness or frontal sinus tenderness.     Mouth/Throat:     Lips: Pink.     Mouth: Mucous membranes are moist. Mucous membranes are not pale and not dry.     Pharynx: Uvula midline.  Eyes:     General: Lids are normal. Allergic shiner present.        Right eye: No discharge.        Left eye: No discharge.     Conjunctiva/sclera: Conjunctivae normal.     Right eye: Right conjunctiva is not injected. No chemosis.    Left eye: Left conjunctiva is not injected. No chemosis.    Pupils: Pupils are equal, round, and reactive to light.  Cardiovascular:     Rate and Rhythm: Normal rate and regular rhythm.     Heart sounds: Normal heart sounds.  Pulmonary:     Effort: Pulmonary effort is normal. No tachypnea, accessory muscle usage or respiratory distress.     Breath sounds: Normal breath sounds. No wheezing, rhonchi or rales.  Chest:     Chest wall: No tenderness.  Abdominal:     Tenderness: There is no abdominal tenderness. There is no guarding or rebound.  Lymphadenopathy:     Head:     Right side of head: No submandibular, tonsillar or occipital adenopathy.     Left  side of head: No submandibular, tonsillar or occipital adenopathy.     Cervical: No cervical adenopathy.  Skin:  Coloration: Skin is not pale.     Findings: No abrasion, erythema, petechiae or rash. Rash is not papular, urticarial or vesicular.  Neurological:     Mental Status: She is alert.  Psychiatric:        Behavior: Behavior is cooperative.      Diagnostic studies: deferred due to insurance stipulations that refuse to pay for testing at initial visits, making it more difficult for patients to get the care they need          Malachi Bonds, MD [facility]

## 2023-10-18 NOTE — Patient Instructions (Addendum)
1. Swelling of lip, tongue, and throat - We cannot do testing today because insurance companies are not allowing testing on the same day as the first visit. - Stop your antihistamines (if you take any) for 3 days before this. - We will test at the next visit. - Consider vocal cord dysfunction as a diagnosis - Exercises provided (see below) to try next time that this happens. - We can send you to ENT for an evaluation as well, depending on the allergy testing results.   2. Return in about 2 weeks (around 11/01/2023) for SKIN TESTING. You can have the follow up appointment with Dr. Dellis Anes or a Nurse Practicioner (our Nurse Practitioners are excellent and always have Physician oversight!).    Please inform us of any Emergency Department visits, hospitalizations, or changes in symptoms. Call us before going to the ED for breathing or allergy symptoms since we might be able to fit you in for a sick visit. Feel free to contact us anytime with any questions, problems, or concerns.  It was a pleasure to meet you today!  Websites that have reliable patient information: 1. American Academy of Asthma, Allergy, and Immunology: www.aaaai.org 2. Food Allergy Research and Education (FARE): foodallergy.org 3. Mothers of Asthmatics: http://www.asthmacommunitynetwork.org 4. American College of Allergy, Asthma, and Immunology: www.acaai.org   COVID-19 Vaccine Information can be found at: [email or web address].nl For questions related to vaccine distribution or appointments, please email [email or web address] or call [phone].     "Like" [place] on Facebook and Instagram for our latest updates!      A healthy democracy works best when [employer] participate! Make sure you are registered to vote! If you have moved or changed any of your contact information, you will need to get this updated before voting! Scan the QR codes below to learn more!       Vocal Cord Dysfunction (VCD) /Inspiratory Laryngeal Obstruction (ILO)/ Exercise- Induced Laryngeal Obstruction Exercises (EILO)  Practice these techniques several times a day, so that when you need them, they will be automatic, and will work right away (or very fast) to open up the spasming (closed) vocal cords.  PREPARATION: ? Loosen clothing at waist, so nothing is tight or snug. Open top buttons of pants or skirt, unzip pant or skirt zippers, pull shirt out over pants or skirt. This prevents pressure on the stomach, which can cause stomach reflux (backup of corrosive liquid) into the esophagus. Gastric reflux is a frequent cause of VCD attacks. ? Sit in a comfortable chair. When you need to use these techniques, you may be standing, lying, or sitting-BUT first try to learn them while sitting because they are easier to learn in this position. ? Keep water handy. Take sips, so your mouth and throat will not dry out. Swallow carefully, to avoid choking. ? Put your right (or left) thumb on your belly button, with the rest of your fingers below the thumb, so you are GENTLY touching your belly (lower abdomen). ? Doing this will focus your attention on your lower abdominal muscles. ? Relax your chest. Relax your shoulders. Relax your neck. Relax your throat. This helps you to try to use ONLY your belly (lower abdomen) muscles. Consciously try NOT to use chest muscles, etc. Chest muscle use seems to irritate vocal cords while "belly" muscles tend to relax them.  BREATHE OUT (EXHALE) FIRST WITH slightly PURSED LIPS: ? Since most people cannot inhale, (breathe in), during VCD attacks, please start by breathing out (exhaling) in the special way  described below, and this will usually open up the vocal cords, immediately. ? Start, by breathing out (exhaling) with lips "pursed" as if you are trying to gently blow out a candle. ? This is similar to whistling, but your lips will not be as  "puckered' as when whistling and your lips will not be pushed forward, like when whistling. If you look in a mirror, you would see a mostly horizontal line of space between the lips, while you exhale the 'ffffffffffffffffffff'. ? This may be silent, or may sound like a gentle wind or breeze, like 'fffffffffffffff' and your lips should be symmetrical, around your teeth. You lower lip will not be touching your upper teeth, like when someone says the name FFFFFFFRank. This is one continuous flow of exhaled air, either silent, or making a slightly windy sound. ? Feel your hand on your belly (lower abdomen) come IN, a little bit toward your back, as you are 'working' only your lower belly muscles. ? This abdominal exhaled 'fffffffffffffffff' alone often stops VCD attacks very quickly.  ? Some prefer to try a variation of exhaling 'ffffffffffff'. Try exhaling quickly, 'ffff', 'fffff', 'ffff'--all part of one exhalation. Do not inhale in between quick "fff's. This helps some people more quickly open up the spasming vocal cords. This is like blowing out a slightly stubborn candle, exhaling against a tiny bit of resistance (like trying to blow out a trick birthday candle). ? If any of this helps, try to slow down the exhalations, and gently exhale 'ffffffffffffffffff'. ? Some people prefer to exhale gently 'ssssssssssssssss' or 'shhhhhhhhhhhhh' rather than 'fffffffffffffff', etc. Choose what works best in your case, or what is most comfortable for you. ? You may need to repeat exhaling 'ffffffffffffff' (or 'ffff', 'ffff', 'ffff'), etc, several times to relax the spasming vocal cords. Repeating this often stops a VCD attack so that you can inhale (breathe in) again.

## 2023-11-01 ENCOUNTER — Encounter: Payer: Self-pay | Admitting: Allergy & Immunology

## 2023-11-01 ENCOUNTER — Ambulatory Visit (INDEPENDENT_AMBULATORY_CARE_PROVIDER_SITE_OTHER): Payer: Managed Care, Other (non HMO) | Admitting: Allergy & Immunology

## 2023-11-01 VITALS — Ht 62.0 in | Wt 188.0 lb

## 2023-11-01 DIAGNOSIS — J3089 Other allergic rhinitis: Secondary | ICD-10-CM | POA: Diagnosis not present

## 2023-11-01 DIAGNOSIS — R220 Localized swelling, mass and lump, head: Secondary | ICD-10-CM

## 2023-11-01 DIAGNOSIS — R221 Localized swelling, mass and lump, neck: Secondary | ICD-10-CM | POA: Diagnosis not present

## 2023-11-01 DIAGNOSIS — J302 Other seasonal allergic rhinitis: Secondary | ICD-10-CM | POA: Diagnosis not present

## 2023-11-01 MED ORDER — LEVOCETIRIZINE DIHYDROCHLORIDE 5 MG PO TABS: 5.0000 mg | ORAL_TABLET | Freq: Every evening | ORAL | 1 refills | Status: AC

## 2023-11-01 NOTE — Patient Instructions (Addendum)
1. Swelling of lip, tongue, and throat - Testing to environmental allergens showed: grasses, ragweed, weeds, trees, indoor molds, outdoor molds, and dog - Testing to the most common food allergens showed: sesame and milk - I would avoid milk and sesame for now for a couple of weeks to see if this helps.  - However, if there is no improvement, I would put these back into your diet (the testing size was VERY small and likely not relevant). - Copy of test results provided.  - Avoidance measures provided. - We are going to get some labs to look for a mast cell disorder with tryptase as well as an alpha gal panel and egg (to confirm the negative skin testing).  - We will call you in 1-2 weeks with the results of the testing.  - We will hold off on the EpiPen for now, but call us if you change your mind.  - Start taking: Xyzal (levocetirizine) 5mg  tablet once daily to see if this can suppress episodes - You can use an extra dose of the antihistamine, if needed, for breakthrough symptoms.  - Consider nasal saline rinses 1-2 times daily to remove allergens from the nasal cavities as well as help with mucous clearance (this is especially helpful to do before the nasal sprays are given) - Consider allergy shots as a means of long-term control. - Allergy shots "re-train" and "reset" the immune system to ignore environmental allergens and decrease the resulting immune response to those allergens (sneezing, itchy watery eyes, runny nose, nasal congestion, etc).    - Allergy shots improve symptoms in 75-85% of patients.  - We can discuss more at the next appointment if the medications are not working for you.  2. Return in about 4 years (around 11/01/2027). You can have the follow up appointment with Dr. Dellis Anes or a Nurse Practicioner (our Nurse Practitioners are excellent and always have Physician oversight!).    Please inform us of any Emergency Department visits, hospitalizations, or changes in  symptoms. Call us before going to the ED for breathing or allergy symptoms since we might be able to fit you in for a sick visit. Feel free to contact us anytime with any questions, problems, or concerns.  It was a pleasure to see you again today!  Websites that have reliable patient information: 1. American Academy of Asthma, Allergy, and Immunology: www.aaaai.org 2. Food Allergy Research and Education (FARE): foodallergy.org 3. Mothers of Asthmatics: http://www.asthmacommunitynetwork.org 4. American College of Allergy, Asthma, and Immunology: www.acaai.org      "Like" [place] on Facebook and Instagram for our latest updates!      A healthy democracy works best when [employer] participate! Make sure you are registered to vote! If you have moved or changed any of your contact information, you will need to get this updated before voting! Scan the QR codes below to learn more!       Airborne Adult Perc - 11/01/23 0941     Time Antigen Placed 0941    Allergen Manufacturer Waynette Buttery    Location Back    Number of Test 55    1. Control-Buffer 50% Glycerol Negative    2. Control-Histamine 2+    3. [place] 2+    4. [place] 3+    5. Johnson 3+    6. [place] Blue 4+    7. Meadow Fescue Negative    8. Perennial Rye 3+    9. Timothy 3+    10. Ragweed Mix Negative  11. Cocklebur Negative    12. Plantain,  English Negative    13. Baccharis Negative    14. Dog Fennel Negative    15. Russian Thistle Negative    16. Lamb's Quarters Negative    17. Sheep Sorrell Negative    18. Rough Pigweed Negative    19. Marsh Elder, Rough Negative    20. Mugwort, Common Negative    21. Box, Elder Negative    22. Cedar, red Negative    23. Sweet Gum --   +/-   24. Pecan Pollen Negative    25. Pine Mix Negative    26. Walnut, Black Pollen Negative    27. Red Mulberry Negative    28. Ash Mix Negative    29. Birch Mix Negative    30. Beech American Negative    31. Cottonwood, [place] Negative    32.  Hickory, White Negative    33. Maple Mix Negative    34. Oak, [place] Mix Negative    35. Sycamore Eastern Negative    36. Alternaria Alternata Negative    37. Cladosporium Herbarum 2+    38. Aspergillus Mix Negative    39. Penicillium Mix Negative    40. Bipolaris Sorokiniana (Helminthosporium) Negative    41. Drechslera Spicifera (Curvularia) Negative    42. Mucor Plumbeus Negative    43. Fusarium Moniliforme Negative    44. Aureobasidium Pullulans (pullulara) Negative    45. Rhizopus Oryzae Negative    46. Botrytis Cinera Negative    47. Epicoccum Nigrum Negative    48. Phoma Betae 3+    49. Dust Mite Mix Negative    50. Cat Hair 10,000 BAU/ml Negative    51.  Dog Epithelia Negative    52. Mixed Feathers Negative    53. Horse Epithelia Negative    54. Cockroach, German Negative    55. Tobacco Leaf Negative             13 Food Perc - 11/01/23 1009       Test Information   Time Antigen Placed 1610    Allergen Manufacturer Waynette Buttery    Location Back    Number of allergen test 13      Food   1. Peanut Negative    2. Soybean Negative    3. Wheat Negative    4. Sesame --   2 x 2   5. Milk, Cow --   2 x 2   6. Casein Negative    7. Egg White, Chicken Negative    8. Shellfish Mix Negative    9. Fish Mix Negative    10. Cashew Negative    11. Walnut Food Negative    12. Almond Negative    13. Hazelnut Negative             Intradermal - 11/01/23 1011     Time Antigen Placed 1015    Allergen Manufacturer Waynette Buttery    Location Arm    Number of Test 11    Control Negative    Ragweed Mix 3+    Weed Mix 3+    Tree Mix Negative    Mold 2 Negative    Mold 3 Negative    Mold 4 Negative    Mite Mix Negative    Cat Negative    Dog 3+    Cockroach Negative             Reducing Pollen Exposure  The American Academy of Allergy, Asthma and Immunology  suggests the following steps to reduce your exposure to pollen during allergy seasons.    Do not hang sheets  or clothing out to dry; pollen may collect on these items. Do not mow lawns or spend time around freshly cut grass; mowing stirs up pollen. Keep windows closed at night.  Keep car windows closed while driving. Minimize morning activities outdoors, a time when pollen counts are usually at their highest. Stay indoors as much as possible when pollen counts or humidity is high and on windy days when pollen tends to remain in the air longer. Use air conditioning when possible.  Many air conditioners have filters that trap the pollen spores. Use a HEPA room air filter to remove pollen form the indoor air you breathe.  Control of Mold Allergen   Mold and fungi can grow on a variety of surfaces provided certain temperature and moisture conditions exist.  Outdoor molds grow on plants, decaying vegetation and soil.  The major outdoor mold, Alternaria and Cladosporium, are found in very high numbers during hot and dry conditions.  Generally, a late Summer - Fall peak is seen for common outdoor fungal spores.  Rain will temporarily lower outdoor mold spore count, but counts rise rapidly when the rainy period ends.  The most important indoor molds are Aspergillus and Penicillium.  Dark, humid and poorly ventilated basements are ideal sites for mold growth.  The next most common sites of mold growth are the bathroom and the kitchen.  Outdoor (Seasonal) Mold Control  Positive outdoor molds via skin testing: Cladosporium  Use air conditioning and keep windows closed Avoid exposure to decaying vegetation. Avoid leaf raking. Avoid grain handling. Consider wearing a face mask if working in moldy areas.    Indoor (Perennial) Mold Control   Positive indoor molds via skin testing: Phoma  Maintain humidity below 50%. Clean washable surfaces with 5% bleach solution. Remove sources e.g. contaminated carpets.  Control of Dog or Cat Allergen  Avoidance is the best way to manage a dog or cat allergy. If you  have a dog or cat and are allergic to dog or cats, consider removing the dog or cat from the home. If you have a dog or cat but don't want to find it a new home, or if your family wants a pet even though someone in the household is allergic, here are some strategies that may help keep symptoms at bay:  Keep the pet out of your bedroom and restrict it to only a few rooms. Be advised that keeping the dog or cat in only one room will not limit the allergens to that room. Don't pet, hug or kiss the dog or cat; if you do, wash your hands with soap and water. High-efficiency particulate air (HEPA) cleaners run continuously in a bedroom or living room can reduce allergen levels over time. Regular use of a high-efficiency vacuum cleaner or a central vacuum can reduce allergen levels. Giving your dog or cat a bath at least once a week can reduce airborne allergen.  Allergy Shots  Allergies are the result of a chain reaction that starts in the immune system. Your immune system controls how your body defends itself. For instance, if you have an allergy to pollen, your immune system identifies pollen as an invader or allergen. Your immune system overreacts by producing antibodies called Immunoglobulin E (IgE). These antibodies travel to cells that release chemicals, causing an allergic reaction.  The concept behind allergy immunotherapy, whether it is received in  the form of shots or tablets, is that the immune system can be desensitized to specific allergens that trigger allergy symptoms. Although it requires time and patience, the payback can be long-term relief. Allergy injections contain a dilute solution of those substances that you are allergic to based upon your skin testing and allergy history.   How Do Allergy Shots Work?  Allergy shots work much like a vaccine. Your body responds to injected amounts of a particular allergen given in increasing doses, eventually developing a resistance and tolerance to  it. Allergy shots can lead to decreased, minimal or no allergy symptoms.  There generally are two phases: build-up and maintenance. Build-up often ranges from three to six months and involves receiving injections with increasing amounts of the allergens. The shots are typically given once or twice a week, though more rapid build-up schedules are sometimes used.  The maintenance phase begins when the most effective dose is reached. This dose is different for each person, depending on how allergic you are and your response to the build-up injections. Once the maintenance dose is reached, there are longer periods between injections, typically two to four weeks.  Occasionally doctors give cortisone-type shots that can temporarily reduce allergy symptoms. These types of shots are different and should not be confused with allergy immunotherapy shots.  Who Can Be Treated with Allergy Shots?  Allergy shots may be a good treatment approach for people with allergic rhinitis (hay fever), allergic asthma, conjunctivitis (eye allergy) or stinging insect allergy.   Before deciding to begin allergy shots, you should consider:   The length of allergy season and the severity of your symptoms  Whether medications and/or changes to your environment can control your symptoms  Your desire to avoid long-term medication use  Time: allergy immunotherapy requires a major time commitment  Cost: may vary depending on your insurance coverage  Allergy shots for children age 43 and older are effective and often well tolerated. They might prevent the onset of new allergen sensitivities or the progression to asthma.  Allergy shots are not started on patients who are pregnant but can be continued on patients who become pregnant while receiving them. In some patients with other medical conditions or who take certain common medications, allergy shots may be of risk. It is important to mention other medications you talk to your  allergist.   What are the two types of build-ups offered:   RUSH or Rapid Desensitization -- one day of injections lasting from 8:30-4:30pm, injections every 1 hour.  Approximately half of the build-up process is completed in that one day.  The following week, normal build-up is resumed, and this entails ~16 visits either weekly or twice weekly, until reaching your "maintenance dose" which is continued weekly until eventually getting spaced out to every month for a duration of 3 to 5 years. The regular build-up appointments are nurse visits where the injections are administered, followed by required monitoring for 30 minutes.    Traditional build-up -- weekly visits for 6 -12 months until reaching "maintenance dose", then continue weekly until eventually spacing out to every 4 weeks as above. At these appointments, the injections are administered, followed by required monitoring for 30 minutes.     Either way is acceptable, and both are equally effective. With the rush protocol, the advantage is that less time is spent here for injections overall AND you would also reach maintenance dosing faster (which is when the clinical benefit starts to become more apparent). Not everyone  is a candidate for rapid desensitization.   IF we proceed with the RUSH protocol, there are premedications which must be taken the day before and the day after the rush only (this includes antihistamines, steroids, and Singulair).  After the rush day, no prednisone or Singulair is required, and we just recommend antihistamines taken on your injection day.  What Is An Estimate of the Costs?  If you are interested in starting allergy injections, please check with your insurance company about your coverage for both allergy vial sets and allergy injections.  Please do so prior to making the appointment to start injections.  The following are CPT codes to give to your insurance company. These are the amounts we BILL to the insurance  company, but the amount YOU WILL PAY and WE RECEIVE IS SUBSTANTIALLY LESS and depends on the contracts we have with different insurance companies.   Amount Billed to Insurance One allergy vial set  CPT 95165   $ 1200     Two allergy vial set  CPT 95165   $ 2400     Three allergy vial set  CPT 95165   $ 3600     One injection   CPT 95115   $ 35  Two injections   CPT 95117   $ 40 RUSH (Rapid Desensitization) CPT 95180 x 8 hours $500/hour  Regarding the allergy injections, your co-pay may or may not apply with each injection, so please confirm this with your insurance company. When you start allergy injections, 1 or 2 sets of vials are made based on your allergies.  Not all patients can be on one set of vials. A set of vials lasts 6 months to a year depending on how quickly you can proceed with your build-up of your allergy injections. Vials are personalized for each patient depending on their specific allergens.  How often are allergy injection given during the build-up period?   Injections are given at least weekly during the build-up period until your maintenance dose is achieved. Per the doctor's discretion, you may have the option of getting allergy injections two times per week during the build-up period. However, there must be at least 48 hours between injections. The build-up period is usually completed within 6-12 months depending on your ability to schedule injections and for adjustments for reactions. When maintenance dose is reached, your injection schedule is gradually changed to every two weeks and later to every three weeks. Injections will then continue every 4 weeks. Usually, injections are continued for a total of 3-5 years.   When Will I Feel Better?  Some may experience decreased allergy symptoms during the build-up phase. For others, it may take as long as 12 months on the maintenance dose. If there is no improvement after a year of maintenance, your allergist will discuss other  treatment options with you.  If you aren't responding to allergy shots, it may be because there is not enough dose of the allergen in your vaccine or there are missing allergens that were not identified during your allergy testing. Other reasons could be that there are high levels of the allergen in your environment or major exposure to non-allergic triggers like tobacco smoke.  What Is the Length of Treatment?  Once the maintenance dose is reached, allergy shots are generally continued for three to five years. The decision to stop should be discussed with your allergist at that time. Some people may experience a permanent reduction of allergy symptoms. Others may relapse and  a longer course of allergy shots can be considered.  What Are the Possible Reactions?  The two types of adverse reactions that can occur with allergy shots are local and systemic. Common local reactions include very mild redness and swelling at the injection site, which can happen immediately or several hours after. Report a delayed reaction from your last injection. These include arm swelling or runny nose, watery eyes or cough that occurs within 12-24 hours after injection. A systemic reaction, which is less common, affects the entire body or a particular body system. They are usually mild and typically respond quickly to medications. Signs include increased allergy symptoms such as sneezing, a stuffy nose or hives.   Rarely, a serious systemic reaction called anaphylaxis can develop. Symptoms include swelling in the throat, wheezing, a feeling of tightness in the chest, nausea or dizziness. Most serious systemic reactions develop within 30 minutes of allergy shots. This is why it is strongly recommended you wait in your doctor's office for 30 minutes after your injections. Your allergist is trained to watch for reactions, and his or her staff is trained and equipped with the proper medications to identify and treat them.    Report to the nurse immediately if you experience any of the following symptoms: swelling, itching or redness of the skin, hives, watery eyes/nose, breathing difficulty, excessive sneezing, coughing, stomach pain, diarrhea, or light headedness. These symptoms may occur within 15-20 minutes after injection and may require medication.   Who Should Administer Allergy Shots?  The preferred location for receiving shots is your prescribing allergist's office. Injections can sometimes be given at another facility where the physician and staff are trained to recognize and treat reactions, and have received instructions by your prescribing allergist.  What if I am late for an injection?   Injection dose will be adjusted depending upon how many days or weeks you are late for your injection.   What if I am sick?   Please report any illness to the nurse before receiving injections. She may adjust your dose or postpone injections depending on your symptoms. If you have fever, flu, sinus infection or chest congestion it is best to postpone allergy injections until you are better. Never get an allergy injection if your asthma is causing you problems. If your symptoms persist, seek out medical care to get your health problem under control.  What If I am or Become Pregnant:  Women that become pregnant should schedule an appointment with [facility] before receiving any further allergy injections.

## 2023-11-01 NOTE — Progress Notes (Signed)
FOLLOW UP  Date of Service/Encounter:  11/01/23   Assessment:   Swelling of lip, tongue, and throat - with testing positive multiple environmental and food allergens  Seasonal and perennial allergic rhinitis (grasses, ragweed, weeds, trees, indoor molds, outdoor molds, and dog)  Chronic ear fullness   Plan/Recommendations:   1. Swelling of lip, tongue, and throat - Testing to environmental allergens showed: grasses, ragweed, weeds, trees, indoor molds, outdoor molds, and dog - Testing to the most common food allergens showed: sesame and milk - I would avoid milk and sesame for now for a couple of weeks to see if this helps.  - However, if there is no improvement, I would put these back into your diet (the testing size was VERY small and likely not relevant). - Copy of test results provided.  - Avoidance measures provided. - We are going to get some labs to look for a mast cell disorder with tryptase as well as an alpha gal panel and egg (to confirm the negative skin testing).  - We will call you in 1-2 weeks with the results of the testing.  - We will hold off on the EpiPen for now, but call us if you change your mind.  - Start taking: Xyzal (levocetirizine) 5mg  tablet once daily to see if this can suppress episodes - You can use an extra dose of the antihistamine, if needed, for breakthrough symptoms.  - Consider nasal saline rinses 1-2 times daily to remove allergens from the nasal cavities as well as help with mucous clearance (this is especially helpful to do before the nasal sprays are given) - Consider allergy shots as a means of long-term control. - Allergy shots "re-train" and "reset" the immune system to ignore environmental allergens and decrease the resulting immune response to those allergens (sneezing, itchy watery eyes, runny nose, nasal congestion, etc).    - Allergy shots improve symptoms in 75-85% of patients.  - We can discuss more at the next appointment if the  medications are not working for you.  2. Return in about 4 years (around 11/01/2027). You can have the follow up appointment with Dr. Dellis Anes or a Nurse Practicioner (our Nurse Practitioners are excellent and always have Physician oversight!).       Subjective:   Elizabeth Abbott is a 57 y.o. female presenting today for follow up of  Chief Complaint  Patient presents with   Allergy Testing    1-55 and 1-13    Elizabeth Abbott has a history of the following: There are no problems to display for this patient.   History obtained from: chart review and patient.  Discussed the use of AI scribe software for clinical note transcription with the patient and/or guardian, who gave verbal consent to proceed.  Elizabeth Abbott is a 57 y.o. female presenting for skin testing. She was last seen on October 29th, 2024. We could not do testing because her insurance company does not cover testing on the same day as a New Patient visit. She has been off of all antihistamines 3 days in anticipation of the testing.   Otherwise, there have been no changes to her past medical history, surgical history, family history, or social history.    Review of systems otherwise negative other than that mentioned in the HPI.    Objective:   Height 5\' 2"  (1.575 m), weight 188 lb (85.3 kg). Body mass index is 34.39 kg/m.    Physical exam deferred since this was a skin testing  appointment only.   Diagnostic studies:   Allergy Studies:     Airborne Adult Perc - 11/01/23 0941     Time Antigen Placed 0941    Allergen Manufacturer Waynette Buttery    Location Back    Number of Test 55    1. Control-Buffer 50% Glycerol Negative    2. Control-Histamine 2+    3. Bahia 2+    4. [place] 3+    5. Johnson 3+    6. Kentucky Blue 4+    7. Meadow Fescue Negative    8. Perennial Rye 3+    9. Timothy 3+    10. Ragweed Mix Negative    11. Cocklebur Negative    12. Plantain,  English Negative    13. Baccharis Negative     14. Dog Fennel Negative    15. Russian Thistle Negative    16. Lamb's Quarters Negative    17. Sheep Sorrell Negative    18. Rough Pigweed Negative    19. Marsh Elder, Rough Negative    20. Mugwort, Common Negative    21. Box, Elder Negative    22. Cedar, red Negative    23. Sweet Gum --   +/-   24. Pecan Pollen Negative    25. Pine Mix Negative    26. Walnut, Black Pollen Negative    27. Red Mulberry Negative    28. Ash Mix Negative    29. Birch Mix Negative    30. Beech American Negative    31. Cottonwood, Guinea-Bissau Negative    32. Hickory, White Negative    33. Maple Mix Negative    34. Oak, Guinea-Bissau Mix Negative    35. Sycamore Eastern Negative    36. Alternaria Alternata Negative    37. Cladosporium Herbarum 2+    38. Aspergillus Mix Negative    39. Penicillium Mix Negative    40. Bipolaris Sorokiniana (Helminthosporium) Negative    41. Drechslera Spicifera (Curvularia) Negative    42. Mucor Plumbeus Negative    43. Fusarium Moniliforme Negative    44. Aureobasidium Pullulans (pullulara) Negative    45. Rhizopus Oryzae Negative    46. Botrytis Cinera Negative    47. Epicoccum Nigrum Negative    48. Phoma Betae 3+    49. Dust Mite Mix Negative    50. Cat Hair 10,000 BAU/ml Negative    51.  Dog Epithelia Negative    52. Mixed Feathers Negative    53. Horse Epithelia Negative    54. Cockroach, German Negative    55. Tobacco Leaf Negative             13 Food Perc - 11/01/23 1009       Test Information   Time Antigen Placed 1610    Allergen Manufacturer Waynette Buttery    Location Back    Number of allergen test 13      Food   1. Peanut Negative    2. Soybean Negative    3. Wheat Negative    4. Sesame --   2 x 2   5. Milk, Cow --   2 x 2   6. Casein Negative    7. Egg White, Chicken Negative    8. Shellfish Mix Negative    9. Fish Mix Negative    10. Cashew Negative    11. Walnut Food Negative    12. Almond Negative    13. Hazelnut Negative  Intradermal - 11/01/23 1011     Time Antigen Placed 1015    Allergen Manufacturer Waynette Buttery    Location Arm    Number of Test 11    Control Negative    Ragweed Mix 3+    Weed Mix 3+    Tree Mix Negative    Mold 2 Negative    Mold 3 Negative    Mold 4 Negative    Mite Mix Negative    Cat Negative    Dog 3+    Cockroach Negative             Allergy testing results were read and interpreted by myself, documented by clinical staff.      Malachi Bonds, MD  [facility]

## 2023-11-02 NOTE — Addendum Note (Signed)
Addended by: Orson Aloe on: 11/02/2023 05:34 PM   Modules accepted: Orders

## 2023-11-04 LAB — EGG COMPONENT PANEL
F232-IgE Ovalbumin: <0.1 kU/L
F233-IgE Ovomucoid: <0.1 kU/L

## 2023-11-04 LAB — ALPHA-GAL PANEL
Allergen Lamb IgE: <0.1 kU/L
Beef IgE: <0.1 kU/L
IgE (Immunoglobulin E), Serum: 20 [IU]/mL (ref 6–495)
O215-IgE Alpha-Gal: <0.1 kU/L
Pork IgE: <0.1 kU/L

## 2023-11-04 LAB — TRYPTASE: Tryptase: 3.3 ug/L (ref 2.2–13.2)

## 2024-02-28 ENCOUNTER — Ambulatory Visit: Admitting: Podiatry

## 2024-02-28 ENCOUNTER — Encounter: Payer: Self-pay | Admitting: Podiatry

## 2024-02-28 DIAGNOSIS — B351 Tinea unguium: Secondary | ICD-10-CM

## 2024-02-28 DIAGNOSIS — B353 Tinea pedis: Secondary | ICD-10-CM | POA: Diagnosis not present

## 2024-02-28 MED ORDER — KETOCONAZOLE 2 % EX CREA: 1.0000 | TOPICAL_CREAM | Freq: Every day | CUTANEOUS | 2 refills | Status: AC

## 2024-02-28 NOTE — Addendum Note (Signed)
 Addended by: Daryel November on: 02/28/2024 12:00 PM   Modules accepted: Orders

## 2024-02-28 NOTE — Progress Notes (Signed)
  Subjective:  Patient ID: Elizabeth Abbott, female    DOB: 01/07/1966,   MRN: [id number]  No chief complaint on file.   58 y.o. female presents for concern of nail changes and skin changes. Relates this has been ongoing for several months. She went to get a pedicure and relates since then has had problems. Relates some scaling and itchiness on her feet as well. State the left great toenail is the worse. Has tried some monistat but no difference.  . Denies any other pedal complaints. Denies n/v/f/c.   Past Medical History:  Diagnosis Date   Eczema     Objective:  Physical Exam: Vascular: DP/PT pulses 2/4 bilateral. CFT <3 seconds. Normal hair growth on digits. No edema.  Skin. No lacerations or abrasions bilateral feet. Left hallux nail thickend and dystrophic ditally and lifted from nail bed. Mild xerosis and scaling noted to plantar bilateral feet Musculoskeletal: MMT 5/5 bilateral lower extremities in DF, PF, Inversion and Eversion. Deceased ROM in DF of ankle joint.  Neurological: Sensation intact to light touch.   Assessment:   1. Onychomycosis   2. Tinea pedis of both feet      Plan:  Patient was evaluated and treated and all questions answered. -Examined patient -Discussed treatment options for painful dystrophic nails  -Clinical picture and Fungal culture was obtained by removing a portion of the hard nail itself from each of the involved toenails using a sterile nail nipper and sent to [facility] lab. Patient tolerated the biopsy procedure well without discomfort or need for anesthesia.  -Ketoconazole for tinea sent.  -Discussed fungal nail treatment options including oral, topical, and laser treatments.  -Patient to return in 4 weeks for follow up evaluation and discussion of fungal culture results or sooner if symptoms worsen.   Louann Sjogren, DPM

## 2024-03-12 ENCOUNTER — Other Ambulatory Visit: Payer: Self-pay | Admitting: Podiatry

## 2024-03-22 ENCOUNTER — Ambulatory Visit: Admitting: Family Medicine

## 2024-03-22 ENCOUNTER — Encounter: Payer: Self-pay | Admitting: Family Medicine

## 2024-03-22 VITALS — BP 134/84 | HR 71 | Temp 98.6°F | Ht 62.0 in | Wt 189.1 lb

## 2024-03-22 DIAGNOSIS — B009 Herpesviral infection, unspecified: Secondary | ICD-10-CM

## 2024-03-22 DIAGNOSIS — F32A Depression, unspecified: Secondary | ICD-10-CM

## 2024-03-22 DIAGNOSIS — E785 Hyperlipidemia, unspecified: Secondary | ICD-10-CM | POA: Diagnosis not present

## 2024-03-22 DIAGNOSIS — F419 Anxiety disorder, unspecified: Secondary | ICD-10-CM

## 2024-03-22 DIAGNOSIS — G4709 Other insomnia: Secondary | ICD-10-CM

## 2024-03-22 DIAGNOSIS — F321 Major depressive disorder, single episode, moderate: Secondary | ICD-10-CM

## 2024-03-22 DIAGNOSIS — W57XXXA Bitten or stung by nonvenomous insect and other nonvenomous arthropods, initial encounter: Secondary | ICD-10-CM | POA: Insufficient documentation

## 2024-03-22 DIAGNOSIS — I10 Essential (primary) hypertension: Secondary | ICD-10-CM

## 2024-03-22 DIAGNOSIS — J302 Other seasonal allergic rhinitis: Secondary | ICD-10-CM

## 2024-03-22 DIAGNOSIS — G894 Chronic pain syndrome: Secondary | ICD-10-CM | POA: Insufficient documentation

## 2024-03-22 DIAGNOSIS — J3089 Other allergic rhinitis: Secondary | ICD-10-CM

## 2024-03-22 DIAGNOSIS — F17200 Nicotine dependence, unspecified, uncomplicated: Secondary | ICD-10-CM

## 2024-03-22 MED ORDER — AZELASTINE HCL 0.1 % NA SOLN: 2.0000 | Freq: Two times a day (BID) | NASAL | 11 refills | Status: AC

## 2024-03-22 MED ORDER — AMLODIPINE BESYLATE 10 MG PO TABS: 10.0000 mg | ORAL_TABLET | Freq: Every day | ORAL | 1 refills | Status: AC

## 2024-03-22 MED ORDER — VALACYCLOVIR HCL 500 MG PO TABS: 500.0000 mg | ORAL_TABLET | Freq: Two times a day (BID) | ORAL | 3 refills | Status: AC | PRN

## 2024-03-22 MED ORDER — MOMETASONE FUROATE 0.1 % EX CREA: 1.0000 | TOPICAL_CREAM | CUTANEOUS | 1 refills | Status: DC | PRN

## 2024-03-22 MED ORDER — POTASSIUM CHLORIDE CRYS ER 20 MEQ PO TBCR: 20.0000 meq | EXTENDED_RELEASE_TABLET | Freq: Every day | ORAL | 1 refills | Status: AC

## 2024-03-22 MED ORDER — HYDROCHLOROTHIAZIDE 25 MG PO TABS: 25.0000 mg | ORAL_TABLET | Freq: Every day | ORAL | 1 refills | Status: AC

## 2024-03-22 NOTE — Progress Notes (Signed)
 Patient Office Visit  Assessment & Plan:  Essential hypertension -     amLODIPine Besylate; Take 1 tablet (10 mg total) by mouth daily.  Dispense: 90 tablet; Refill: 1 -     hydroCHLOROthiazide; Take 1 tablet (25 mg total) by mouth daily.  Dispense: 90 tablet; Refill: 1 -     Potassium Chloride Crys ER; Take 1 tablet (20 mEq total) by mouth daily.  Dispense: 90 tablet; Refill: 1 -     CBC with Differential/Platelet -     COMPLETE METABOLIC PANEL WITHOUT GFR  Current moderate episode of major depressive disorder without prior episode (HCC)  Anxiety and depression -     TSH  Hyperlipidemia, unspecified hyperlipidemia type -     Lipid panel -     Hemoglobin A1c  HSV (herpes simplex virus) infection -     valACYclovir HCl; Take 1 tablet (500 mg total) by mouth 2 (two) times daily as needed (coldsore).  Dispense: 90 tablet; Refill: 3  Other insomnia -     TSH  Tobacco dependence  Perennial allergic rhinitis with seasonal variation -     Azelastine HCl; Place 2 sprays into both nostrils 2 (two) times daily.  Dispense: 30 mL; Refill: 11  Chronic pain disorder   Test results were reviewed and analyzed as part of the medical decision making of this visit.  Reviewed previous notes from [facility] during the office visit. Recommend healthy diet i.e mediterranean/DASH diet, consistent exercise - 30 minutes 5 day per week, and gradual weight loss. Start Zoloft 25 mg once a day.  Return in 4 to 6 weeks or sooner if necessary.  May need to increase to 50 mg once a day.  Recommended psychotherapy but patient would like to hold off at this time.  Patient will establish with new pain management MD in [facility] who is part of [facility] system.  Patient states she does not need a referral.  Recommend tobacco cessation. Return in about 4 weeks (around 04/19/2024), or if symptoms worsen or fail to improve.   Subjective:    Patient ID: Elizabeth Abbott, female     DOB: 12-10-1966  Age: 58 y.o. MRN: [id number]  No chief complaint on file.   HPI Establish medical care and chronic medical issues.  HTN-using antihypertensive medication without difficulty.  Denies associated signs and symptoms including chest pain, shortness of breath, cough headache, peripheral swelling cramps spasms and palpitations.  Voices understanding of the potential for interference with blood pressure control with substances including high sodium intake, decongestions, herbal supplements weight loss supplements nutritional supplements.  Blood pressures at home are less than 140/90.   Depression/anxiety-patient has been very stressed at work because her previous boss has returned, having PTSD re this.  Also her son has had a lot of medical issues and psych issues in the last couple years which have added to her stress level.  Lately her mom has also had more medical issues and requiring more care and likely her mom will be moving in with her at some point.  Patient believes her mom now has dementia and is requiring more care and is very forgetful. patient has concern re finances. Took  Zoloft briefly before but does not know why she stopped it.  Patient thought it did not help but she was only taking 25 mg once a day.  Is willing to retry this at this time. Chronic pain- Still having ongoing Right shoulder pain, neck  pain, hip pain and low back pain.  Patient states she used to go to pain management and was getting steroid shots but that did not help.  Patient has not seen management in the long time.  Patient is concerned that she will be able to walk at some point.  Patient also did take gabapentin and Flexeril but they were not helping her. Previous history of HSV with recurrent flareups in the past.  Patient does take Valtrex daily and does help and has not had any recent flareups of this. Tobacco use-patient is still smoking a few cigarettes per day.  Patient has been smoking on and off for  over 20 years but does not meet the criteria for lung cancer screening.  Patient not having shortness of breath wheeze or cough.  Patient knows that she needs to quit. [facility]-patient has up-to-date mammogram, up-to-date colonoscopy.  Patient declines the Shingrix vaccine and pneumococcal vaccines. The 10-year ASCVD risk score (Arnett DK, et al., 2019) is: 11.9% The 10-year ASCVD risk score (Arnett DK, et al., 2019) is: 11.9%   Values used to calculate the score:     Age: 58 years     Sex: Female     Is Non-Hispanic African American: Yes     Diabetic: No     Tobacco smoker: Yes     Systolic Blood Pressure: 134 mmHg     Is BP treated: Yes     HDL Cholesterol: 66 mg/dL     Total Cholesterol: 220 mg/dL  Past Medical History:  Diagnosis Date   Anemia    Arthritis    Eczema    Essential hypertension 04/25/2014   .   HSV (herpes simplex virus) infection    Hyperlipidemia    Past Surgical History:  Procedure Laterality Date   breast reduction     CESAREAN SECTION  x2   REDUCTION MAMMAPLASTY Bilateral 2005   Social History   Tobacco Use   Smoking status: Every Day    Current packs/day: 0.50    Average packs/day: 0.5 packs/day for 20.0 years (10.0 ttl pk-yrs)    Types: Cigarettes    Passive exposure: Current   Smokeless tobacco: Never  Vaping Use   Vaping status: Never Used  Substance Use Topics   Alcohol use: Not Currently   Drug use: No   Family History  Problem Relation Age of Onset   Allergic rhinitis Mother    Diabetes Mother    Hyperlipidemia Mother    Seizures Mother        TBI   Cerebral aneurysm Mother    Migraines Mother    Arthritis Mother    Hypertension Mother    Dementia Mother    Hyperlipidemia Father    Neuropathy Sister    Diabetes Sister    Depression Sister    Rheum arthritis Maternal Grandmother    Hypertension Maternal Grandmother    Rheum arthritis Paternal Grandmother    Hypertension Paternal Grandmother    Bipolar disorder Son     HIV Son    Breast cancer Maternal Aunt    Kidney cancer Maternal Aunt    Liver cancer Maternal Aunt    Diabetes Other    Heart failure Other    Hypertension Other    Cervical cancer Other    Cervical cancer Cousin    Parkinson's disease Neg Hx    Multiple sclerosis Neg Hx    Stroke Neg Hx     Allergies  Allergen Reactions   Codeine  Swelling, Anxiety and Hives    Other reaction(s): Anxiety, Hives  Other reaction(s): Anxiety, Hives  Other reaction(s): Anxiety, Hives, Other reaction(s): Anxiety, Hives   Oxycodone Swelling and Anxiety   Amitriptyline Other (See Comments)    Too drowsy   Penicillins     welts   Percocet [Oxycodone-Acetaminophen] Itching    ROS    Objective:    BP 134/84   Pulse 71   Temp 98.6 F (37 C)   Ht 5\' 2"  (1.575 m)   Wt 189 lb 2 oz (85.8 kg)   SpO2 99%   BMI 34.59 kg/m  BP Readings from Last 3 Encounters:  03/22/24 134/84  10/18/23 122/78  08/26/23 (!) 103/93   Wt Readings from Last 3 Encounters:  03/22/24 189 lb 2 oz (85.8 kg)  11/01/23 188 lb (85.3 kg)  10/18/23 187 lb 8 oz (85 kg)    Physical Exam Vitals and nursing note reviewed.  Constitutional:      Appearance: Normal appearance.  HENT:     Head: Normocephalic.     Right Ear: Tympanic membrane, ear canal and external ear normal.     Left Ear: Tympanic membrane, ear canal and external ear normal.  Eyes:     Extraocular Movements: Extraocular movements intact.     Conjunctiva/sclera: Conjunctivae normal.     Pupils: Pupils are equal, round, and reactive to light.  Cardiovascular:     Rate and Rhythm: Normal rate and regular rhythm.     Heart sounds: Normal heart sounds.  Pulmonary:     Effort: Pulmonary effort is normal.     Breath sounds: Normal breath sounds.  Musculoskeletal:     Right lower leg: No edema.     Left lower leg: No edema.  Neurological:     General: No focal deficit present.     Mental Status: She is alert and oriented to person, place, and time.   Psychiatric:        Attention and Perception: Attention normal.        Mood and Affect: Mood normal. Affect is tearful.        Speech: Speech normal.        Behavior: Behavior normal.        Thought Content: Thought content normal.        Judgment: Judgment normal.      Results for orders placed or performed in visit on 03/22/24  HM PAP SMEAR  Result Value Ref Range   HM Pap smear normal   CBC with Differential/Platelet  Result Value Ref Range   WBC 6.7 3.8 - 10.8 Thousand/uL   RBC 4.61 3.80 - 5.10 Million/uL   Hemoglobin 14.4 11.7 - 15.5 g/dL   HCT 13.2 44.0 - 10.2 %   MCV 94.4 80.0 - 100.0 fL   MCH 31.2 27.0 - 33.0 pg   MCHC 33.1 32.0 - 36.0 g/dL   RDW 72.5 36.6 - 44.0 %   Platelets 268 140 - 400 Thousand/uL   MPV 11.0 7.5 - 12.5 fL   Neutro Abs 3,652 1,500 - 7,800 cells/uL   Absolute Lymphocytes 2,097 850 - 3,900 cells/uL   Absolute Monocytes 757 200 - 950 cells/uL   Eosinophils Absolute 127 15 - 500 cells/uL   Basophils Absolute 67 0 - 200 cells/uL   Neutrophils Relative % 54.5 %   Total Lymphocyte 31.3 %   Monocytes Relative 11.3 %   Eosinophils Relative 1.9 %   Basophils Relative 1.0 %  COMPLETE METABOLIC PANEL  WITHOUT GFR  Result Value Ref Range   Glucose, Bld 90 65 - 99 mg/dL   BUN 10 7 - 25 mg/dL   Creat 1.61 0.96 - 0.45 mg/dL   BUN/Creatinine Ratio SEE NOTE: 6 - 22 (calc)   Sodium 140 135 - 146 mmol/L   Potassium 4.5 3.5 - 5.3 mmol/L   Chloride 103 98 - 110 mmol/L   CO2 30 20 - 32 mmol/L   Calcium 9.5 8.6 - 10.4 mg/dL   Total Protein 6.8 6.1 - 8.1 g/dL   Albumin 4.4 3.6 - 5.1 g/dL   Globulin 2.4 1.9 - 3.7 g/dL (calc)   AG Ratio 1.8 1.0 - 2.5 (calc)   Total Bilirubin 0.6 0.2 - 1.2 mg/dL   Alkaline phosphatase (APISO) 65 37 - 153 U/L   AST 16 10 - 35 U/L   ALT 15 6 - 29 U/L  Lipid panel  Result Value Ref Range   Cholesterol 220 (H) <200 mg/dL   HDL 66 > OR = 50 mg/dL   Triglycerides 409 <811 mg/dL   LDL Cholesterol (Calc) 129 (H) mg/dL (calc)    Total CHOL/HDL Ratio 3.3 <5.0 (calc)   Non-HDL Cholesterol (Calc) 154 (H) <130 mg/dL (calc)  Hemoglobin B1Y  Result Value Ref Range   Hgb A1c MFr Bld 5.8 (H) <5.7 % of total Hgb   Mean Plasma Glucose 120 mg/dL   eAG (mmol/L) 6.6 mmol/L  TSH  Result Value Ref Range   TSH 0.86 0.40 - 4.50 mIU/L

## 2024-03-23 ENCOUNTER — Encounter: Payer: Self-pay | Admitting: Family Medicine

## 2024-03-23 LAB — CBC WITH DIFFERENTIAL/PLATELET
Absolute Lymphocytes: 2097 {cells}/uL (ref 850–3900)
Absolute Monocytes: 757 {cells}/uL (ref 200–950)
Basophils Absolute: 67 {cells}/uL (ref 0–200)
Basophils Relative: 1 %
Eosinophils Absolute: 127 {cells}/uL (ref 15–500)
Eosinophils Relative: 1.9 %
HCT: 43.5 % (ref 35.0–45.0)
Hemoglobin: 14.4 g/dL (ref 11.7–15.5)
MCH: 31.2 pg (ref 27.0–33.0)
MCHC: 33.1 g/dL (ref 32.0–36.0)
MCV: 94.4 fL (ref 80.0–100.0)
MPV: 11 fL (ref 7.5–12.5)
Monocytes Relative: 11.3 %
Neutro Abs: 3652 {cells}/uL (ref 1500–7800)
Neutrophils Relative %: 54.5 %
Platelets: 268 10*3/uL (ref 140–400)
RBC: 4.61 10*6/uL (ref 3.80–5.10)
RDW: 13.2 % (ref 11.0–15.0)
Total Lymphocyte: 31.3 %
WBC: 6.7 10*3/uL (ref 3.8–10.8)

## 2024-03-23 LAB — HEMOGLOBIN A1C
Hgb A1c MFr Bld: 5.8 %{Hb} — ABNORMAL HIGH (ref <5.7)
Mean Plasma Glucose: 120 mg/dL
eAG (mmol/L): 6.6 mmol/L

## 2024-03-23 LAB — COMPLETE METABOLIC PANEL WITHOUT GFR
AG Ratio: 1.8 (calc) (ref 1.0–2.5)
ALT: 15 U/L (ref 6–29)
AST: 16 U/L (ref 10–35)
Albumin: 4.4 g/dL (ref 3.6–5.1)
Alkaline phosphatase (APISO): 65 U/L (ref 37–153)
BUN: 10 mg/dL (ref 7–25)
CO2: 30 mmol/L (ref 20–32)
Calcium: 9.5 mg/dL (ref 8.6–10.4)
Chloride: 103 mmol/L (ref 98–110)
Creat: 0.55 mg/dL (ref 0.50–1.03)
Globulin: 2.4 g/dL (ref 1.9–3.7)
Glucose, Bld: 90 mg/dL (ref 65–99)
Potassium: 4.5 mmol/L (ref 3.5–5.3)
Sodium: 140 mmol/L (ref 135–146)
Total Bilirubin: 0.6 mg/dL (ref 0.2–1.2)
Total Protein: 6.8 g/dL (ref 6.1–8.1)

## 2024-03-23 LAB — LIPID PANEL
Cholesterol: 220 mg/dL — ABNORMAL HIGH (ref <200)
HDL: 66 mg/dL (ref >=50)
LDL Cholesterol (Calc): 129 mg/dL — ABNORMAL HIGH
Non-HDL Cholesterol (Calc): 154 mg/dL — ABNORMAL HIGH (ref <130)
Total CHOL/HDL Ratio: 3.3 (calc) (ref <5.0)
Triglycerides: 135 mg/dL (ref <150)

## 2024-03-23 LAB — TSH: TSH: 0.86 m[IU]/L (ref 0.40–4.50)

## 2024-04-25 ENCOUNTER — Ambulatory Visit: Admitting: Family Medicine

## 2024-06-26 ENCOUNTER — Ambulatory Visit: Admitting: Family Medicine

## 2024-10-12 ENCOUNTER — Ambulatory Visit: Admitting: Podiatry

## 2024-10-19 ENCOUNTER — Ambulatory Visit: Admitting: Podiatry

## 2024-10-26 ENCOUNTER — Ambulatory Visit: Admitting: Podiatry

## 2024-11-02 ENCOUNTER — Encounter: Payer: Self-pay | Admitting: Podiatry

## 2024-11-02 ENCOUNTER — Ambulatory Visit: Admitting: Podiatry

## 2024-11-02 DIAGNOSIS — M722 Plantar fascial fibromatosis: Secondary | ICD-10-CM

## 2024-11-02 DIAGNOSIS — M62461 Contracture of muscle, right lower leg: Secondary | ICD-10-CM | POA: Diagnosis not present

## 2024-11-02 DIAGNOSIS — M62462 Contracture of muscle, left lower leg: Secondary | ICD-10-CM

## 2024-11-02 NOTE — Progress Notes (Signed)
 Subjective:  Patient ID: Elizabeth Abbott, female    DOB: 07-22-1966,  MRN: [id number]  Chief Complaint  Patient presents with   Foot Pain    Bilateral foot pain pt stated that it has been going on for about 8 weeks no recent injuries she stated that it is worse in the morning     58 y.o. female presents with the above complaint.  Patient presents with bilateral plantar fasciitis has been on for 8 weeks no recent injuries states worsening in the morning she wanted get it evaluated hurts with ambulation or shoe pressure pain scale 7 out of 10 dull aching nature.  She would like to discuss treatment options for this.   Review of Systems: Negative except as noted in the HPI. Denies N/V/F/Ch.  Past Medical History:  Diagnosis Date   Anemia    Arthritis    Eczema    Essential hypertension 04/25/2014   .   HSV (herpes simplex virus) infection    Hyperlipidemia     Current Outpatient Medications:    amLODipine  (NORVASC ) 10 MG tablet, Take 1 tablet (10 mg total) by mouth daily., Disp: 90 tablet, Rfl: 1   azelastine  (ASTELIN ) 0.1 % nasal spray, Place 2 sprays into both nostrils 2 (two) times daily., Disp: 30 mL, Rfl: 11   cyclobenzaprine  (FLEXERIL ) 10 MG tablet, Take 1 tablet (10 mg total) by mouth 2 (two) times daily as needed for muscle spasms., Disp: 20 tablet, Rfl: 0   hydrochlorothiazide  (HYDRODIURIL ) 25 MG tablet, Take 1 tablet (25 mg total) by mouth daily., Disp: 90 tablet, Rfl: 1   ketoconazole  (NIZORAL ) 2 % cream, Apply 1 Application topically daily., Disp: 60 g, Rfl: 2   levocetirizine (XYZAL ) 5 MG tablet, Take 1 tablet (5 mg total) by mouth every evening., Disp: 90 tablet, Rfl: 1   magic mouthwash SOLN, Take 15 mLs by mouth 3 (three) times daily., Disp: 150 mL, Rfl: 0   methylPREDNISolone (MEDROL DOSEPAK) 4 MG TBPK tablet, Take 4 mg by mouth See admin instructions. follow package directions, Disp: , Rfl:    naproxen  (NAPROSYN ) 500 MG tablet, Take 1 tablet (500 mg total) by mouth 2  (two) times daily., Disp: 30 tablet, Rfl: 0   potassium chloride  SA (KLOR-CON  M) 20 MEQ tablet, Take 1 tablet (20 mEq total) by mouth daily., Disp: 90 tablet, Rfl: 1   sertraline (ZOLOFT) 25 MG tablet, Take 1 tablet by mouth daily., Disp: , Rfl:    valACYclovir  (VALTREX ) 500 MG tablet, Take 1 tablet (500 mg total) by mouth 2 (two) times daily as needed (coldsore)., Disp: 90 tablet, Rfl: 3  Social History   Tobacco Use  Smoking Status Every Day   Current packs/day: 0.50   Average packs/day: 0.5 packs/day for 20.0 years (10.0 ttl pk-yrs)   Types: Cigarettes   Passive exposure: Current  Smokeless Tobacco Never    Allergies  Allergen Reactions   Codeine Swelling, Anxiety and Hives    Other reaction(s): Anxiety, Hives  Other reaction(s): Anxiety, Hives  Other reaction(s): Anxiety, Hives, Other reaction(s): Anxiety, Hives   Oxycodone Swelling and Anxiety   Amitriptyline Other (See Comments)    Too drowsy   Penicillins     welts   Percocet [Oxycodone-Acetaminophen ] Itching   Objective:  There were no vitals filed for this visit. There is no height or weight on file to calculate BMI. Constitutional Well developed. Well nourished.  Vascular Dorsalis pedis pulses palpable bilaterally. Posterior tibial pulses palpable bilaterally. Capillary refill normal to  all digits.  No cyanosis or clubbing noted. Pedal hair growth normal.  Neurologic Normal speech. Oriented to person, place, and time. Epicritic sensation to light touch grossly present bilaterally.  Dermatologic Nails well groomed and normal in appearance. No open wounds. No skin lesions.  Orthopedic: Normal joint ROM without pain or crepitus bilaterally. No visible deformities. Tender to palpation at the calcaneal tuber bilaterally. No pain with calcaneal squeeze bilaterally. Ankle ROM diminished range of motion bilaterally. Silfverskiold Test: negative bilaterally.   Radiographs: None  Assessment:  No diagnosis  found. Plan:  Patient was evaluated and treated and all questions answered.  Plantar Fasciitis, bilaterally with underlying gastrocnemius equinus - XR reviewed as above.  - Educated on icing and stretching. Instructions given.  - Injection delivered to the plantar fascia as below. - DME: Plantar fascial brace dispensed to support the medial longitudinal arch of the foot and offload pressure from the heel and prevent arch collapse during weightbearing - Pharmacologic management: None  Procedure: Injection Tendon/Ligament Location: Bilateral plantar fascia at the glabrous junction; medial approach. Skin Prep: alcohol Injectate: 0.5 cc 0.5% marcaine plain, 0.5 cc of 1% Lidocaine , 0.5 cc kenalog 10. Disposition: Patient tolerated procedure well. Injection site dressed with a band-aid.  No follow-ups on file.

## 2024-11-16 LAB — HM MAMMOGRAPHY

## 2024-11-30 ENCOUNTER — Ambulatory Visit: Admitting: Podiatry
